# Patient Record
Sex: Male | Born: 1975
Health system: Southern US, Community
[De-identification: ages and names within clinical notes are randomized; demographics above are authoritative.]

## PROBLEM LIST (undated history)

## (undated) DIAGNOSIS — E559 Vitamin D deficiency, unspecified: Secondary | ICD-10-CM

## (undated) DIAGNOSIS — H8109 Meniere's disease, unspecified ear: Secondary | ICD-10-CM

## (undated) DIAGNOSIS — J302 Other seasonal allergic rhinitis: Secondary | ICD-10-CM

## (undated) DIAGNOSIS — I10 Essential (primary) hypertension: Secondary | ICD-10-CM

## (undated) DIAGNOSIS — B029 Zoster without complications: Secondary | ICD-10-CM

## (undated) DIAGNOSIS — F419 Anxiety disorder, unspecified: Secondary | ICD-10-CM

## (undated) DIAGNOSIS — T7840XA Allergy, unspecified, initial encounter: Secondary | ICD-10-CM

## (undated) DIAGNOSIS — G510 Bell's palsy: Secondary | ICD-10-CM

## (undated) DIAGNOSIS — J329 Chronic sinusitis, unspecified: Secondary | ICD-10-CM

## (undated) HISTORY — DX: Other seasonal allergic rhinitis: J30.2

## (undated) HISTORY — DX: Bell's palsy: G51.0

## (undated) HISTORY — DX: Vitamin D deficiency, unspecified: E55.9

## (undated) HISTORY — DX: Allergy, unspecified, initial encounter: T78.40XA

## (undated) HISTORY — DX: Zoster without complications: B02.9

## (undated) HISTORY — DX: Meniere's disease, unspecified ear: H81.09

## (undated) HISTORY — DX: Chronic sinusitis, unspecified: J32.9

## (undated) HISTORY — DX: Essential (primary) hypertension: I10

## (undated) HISTORY — DX: Anxiety disorder, unspecified: F41.9

## (undated) HISTORY — PX: NASAL SINUS SURGERY: SHX719

---

## 1999-07-30 ENCOUNTER — Emergency Department (HOSPITAL_COMMUNITY): Admission: EM | Admit: 1999-07-30 | Discharge: 1999-07-30 | Payer: Self-pay | Admitting: Emergency Medicine

## 1999-10-15 ENCOUNTER — Encounter: Payer: Self-pay | Admitting: Nephrology

## 1999-10-15 ENCOUNTER — Encounter: Admission: RE | Admit: 1999-10-15 | Discharge: 1999-10-15 | Payer: Self-pay | Admitting: Nephrology

## 1999-12-22 ENCOUNTER — Emergency Department (HOSPITAL_COMMUNITY): Admission: EM | Admit: 1999-12-22 | Discharge: 1999-12-22 | Payer: Self-pay | Admitting: Emergency Medicine

## 2000-06-10 ENCOUNTER — Emergency Department (HOSPITAL_COMMUNITY): Admission: EM | Admit: 2000-06-10 | Discharge: 2000-06-10 | Payer: Self-pay | Admitting: Emergency Medicine

## 2001-07-21 ENCOUNTER — Emergency Department (HOSPITAL_COMMUNITY): Admission: EM | Admit: 2001-07-21 | Discharge: 2001-07-21 | Payer: Self-pay | Admitting: Emergency Medicine

## 2007-10-17 ENCOUNTER — Emergency Department (HOSPITAL_COMMUNITY): Admission: EM | Admit: 2007-10-17 | Discharge: 2007-10-17 | Payer: Self-pay | Admitting: Emergency Medicine

## 2011-02-04 ENCOUNTER — Ambulatory Visit: Payer: Self-pay | Admitting: Internal Medicine

## 2011-02-25 ENCOUNTER — Ambulatory Visit: Payer: Self-pay | Admitting: Internal Medicine

## 2012-03-27 ENCOUNTER — Other Ambulatory Visit: Payer: Self-pay | Admitting: *Deleted

## 2012-03-27 ENCOUNTER — Ambulatory Visit (INDEPENDENT_AMBULATORY_CARE_PROVIDER_SITE_OTHER): Payer: 59 | Admitting: Family Medicine

## 2012-03-27 VITALS — BP 146/90 | HR 95 | Temp 98.8°F | Resp 18 | Ht 75.0 in | Wt 271.8 lb

## 2012-03-27 DIAGNOSIS — J019 Acute sinusitis, unspecified: Secondary | ICD-10-CM

## 2012-03-27 DIAGNOSIS — R05 Cough: Secondary | ICD-10-CM

## 2012-03-27 DIAGNOSIS — R0981 Nasal congestion: Secondary | ICD-10-CM

## 2012-03-27 DIAGNOSIS — J3489 Other specified disorders of nose and nasal sinuses: Secondary | ICD-10-CM

## 2012-03-27 DIAGNOSIS — J029 Acute pharyngitis, unspecified: Secondary | ICD-10-CM

## 2012-03-27 DIAGNOSIS — R059 Cough, unspecified: Secondary | ICD-10-CM

## 2012-03-27 MED ORDER — PREDNISONE 10 MG PO TABS: ORAL_TABLET | ORAL | Status: DC

## 2012-03-27 MED ORDER — AMOXICILLIN 875 MG PO TABS: 875.0000 mg | ORAL_TABLET | Freq: Two times a day (BID) | ORAL | Status: DC

## 2012-03-27 MED ORDER — HYDROCODONE-HOMATROPINE 5-1.5 MG/5ML PO SYRP: 5.0000 mL | ORAL_SOLUTION | Freq: Every evening | ORAL | Status: DC | PRN

## 2012-03-27 NOTE — Progress Notes (Signed)
[facility]:  Office Visit  Chief Complaint:  Chief Complaint  Patient presents with  . Cough    been having trouble for two months and feels like he is using too much nasal spray  . Sore Throat  . Nasal Congestion    HPI: Warren Herrera is a 36 y.o. male who complains of  Sinus /URI issues, severe sinus pressure and congestion x several months but has been busy and unable to come in now it is worse. Tried Afrin without relief, has been taking it a lot. Has had sinus surgeries years ago but still has sinus and congestion. Throat pain, can't sleep at night. Self medicate without relief. Started having HA and throat pain x 1.5 weeks. Tried Claritin and Mucinex and Afrin without relief. He thinks he has been to ENT before but does not want to go back there.   Past Medical History  Diagnosis Date  . Allergy    Past Surgical History  Procedure Date  . Nasal sinus surgery    History   Social History  . Marital Status: Married    Spouse Name: N/A    Number of Children: N/A  . Years of Education: N/A   Social History Main Topics  . Smoking status: Former Games developer  . Smokeless tobacco: None  . Alcohol Use: No  . Drug Use: No  . Sexually Active: None   Other Topics Concern  . None   Social History Narrative  . None   Family History  Problem Relation Age of Onset  . Hypertension Mother   . Thyroid disease Mother    No Known Allergies Prior to Admission medications   Medication Sig Start Date End Date Taking? Authorizing Provider  guaiFENesin (MUCINEX) 600 MG 12 hr tablet Take 1,200 mg by mouth 2 (two) times daily.   Yes Historical Provider, MD  oxymetazoline (AFRIN) 0.05 % nasal spray Place 2 sprays into the nose 4 (four) times daily.   Yes Historical Provider, MD     ROS: The patient denies fevers, chills, night sweats, unintentional weight loss, chest pain, palpitations, wheezing, dyspnea on exertion, nausea, vomiting, abdominal pain, dysuria,  hematuria, melena, numbness, weakness, or tingling.   All other systems have been reviewed and were otherwise negative with the exception of those mentioned in the HPI and as above.    PHYSICAL EXAM: Filed Vitals:   03/27/12 1639  BP: 146/90  Pulse: 95  Temp: 98.8 F (37.1 C)  Resp: 18   Filed Vitals:   03/27/12 1639  Height: 6\' 3"  (1.905 m)  Weight: 271 lb 12.8 oz (123.288 kg)   Body mass index is 33.97 kg/(m^2).  General: Alert, no acute distress HEENT:  Normocephalic, atraumatic, oropharynx patent. + bilateral frontal and maxillary sinus tenderness Cardiovascular:  Regular rate and rhythm, no rubs murmurs or gallops.  No Carotid bruits, radial pulse intact. No pedal edema.  Respiratory: Clear to auscultation bilaterally.  No wheezes, rales, or rhonchi.  No cyanosis, no use of accessory musculature GI: No organomegaly, abdomen is soft and non-tender, positive bowel sounds.  No masses. Skin: No rashes. Neurologic: Facial musculature symmetric. Psychiatric: Patient is appropriate throughout our interaction. Lymphatic: No cervical lymphadenopathy Musculoskeletal: Gait intact.   LABS: No results found for this or any previous visit.   EKG/XRAY:   Primary read interpreted by Dr. Conley Rolls at [facility].   ASSESSMENT/PLAN: Encounter Diagnoses  Name Primary?  . Acute sinusitis Yes  . Nasal congestion   . Cough   .  Pharyngitis     Rx Amoxacillin Rx Prednisone taper  Rx Hydromet syrup ( pt given rx)  Refer to ENT prn  F/u prn  Allex Madia PHUONG, DO 03/27/2012 5:46 PM

## 2012-04-01 ENCOUNTER — Telehealth: Payer: Self-pay

## 2012-04-01 MED ORDER — FLUTICASONE PROPIONATE 50 MCG/ACT NA SUSP: 2.0000 | Freq: Every day | NASAL | Status: DC

## 2012-04-01 NOTE — Telephone Encounter (Signed)
rx sent

## 2012-04-01 NOTE — Telephone Encounter (Signed)
Pt states that he is experiencing severe congestion, pt would like to know if flonase can be sent over to the pharmacy for him.  [facility]# [id number] [facility]: [facility] [address]

## 2012-04-02 ENCOUNTER — Telehealth: Payer: Self-pay

## 2012-04-02 NOTE — Telephone Encounter (Signed)
PT WANTED THE FLONASE THAT WE CALLED INTO [facility] [address] CALLED INTO THE [facility] [address]. [facility] [address] WAS WAS LISTED AS PT'S PREFERED PHARMACY IN THE ORIGINAL MESSAGE TAKEN. [facility]# [phone]

## 2012-04-02 NOTE — Telephone Encounter (Signed)
[facility] RX sent in.

## 2012-04-02 NOTE — Telephone Encounter (Deleted)
ERROR

## 2012-04-03 NOTE — Telephone Encounter (Signed)
Called pt, he can have the pharmacy transfer this

## 2015-08-14 DIAGNOSIS — R030 Elevated blood-pressure reading, without diagnosis of hypertension: Secondary | ICD-10-CM | POA: Insufficient documentation

## 2018-01-28 ENCOUNTER — Ambulatory Visit: Payer: Self-pay | Admitting: Family Medicine

## 2018-02-26 ENCOUNTER — Other Ambulatory Visit: Payer: Self-pay

## 2018-02-26 ENCOUNTER — Emergency Department (INDEPENDENT_AMBULATORY_CARE_PROVIDER_SITE_OTHER)
Admission: EM | Admit: 2018-02-26 | Discharge: 2018-02-26 | Disposition: A | Discharge status: Home / Self Care | Payer: 59 | Source: Home / Self Care | Attending: Family Medicine | Admitting: Family Medicine

## 2018-02-26 ENCOUNTER — Emergency Department (INDEPENDENT_AMBULATORY_CARE_PROVIDER_SITE_OTHER): Payer: 59

## 2018-02-26 DIAGNOSIS — M549 Dorsalgia, unspecified: Secondary | ICD-10-CM | POA: Diagnosis not present

## 2018-02-26 DIAGNOSIS — R0789 Other chest pain: Secondary | ICD-10-CM

## 2018-02-26 DIAGNOSIS — R079 Chest pain, unspecified: Secondary | ICD-10-CM

## 2018-02-26 MED ORDER — VALACYCLOVIR HCL 1 G PO TABS: 1000.0000 mg | ORAL_TABLET | Freq: Three times a day (TID) | ORAL | 0 refills | Status: DC

## 2018-02-26 MED ORDER — HYDROCODONE-ACETAMINOPHEN 5-325 MG PO TABS: ORAL_TABLET | ORAL | 0 refills | Status: DC

## 2018-02-26 NOTE — ED Triage Notes (Signed)
Pt c/o a piercing back pain that radiates around the right side of chest. Started last Sunday. Denies Injury. Worse at night especially lying down. Taking ibuprofen and a muscle relaxer prn with little relief. Pain 7/10

## 2018-02-26 NOTE — Discharge Instructions (Addendum)
Begin taking Valtrex if rash appears on chest

## 2018-02-26 NOTE — ED Provider Notes (Signed)
[facility]    CSN: [id number] Arrival date & time: 02/26/18  1547     History   Chief Complaint Chief Complaint  Patient presents with  . Back Pain    HPI Warren Herrera is a 42 y.o. male.   Six days ago patient began developing a vague sharp ache in his right lateral chest.  The pain initially was intermittent, and now constant, worse at night.  He recalls no injury.  He denies cough or recent URI.  No fevers, chills, and sweats, and no rash.  The history is provided by the patient.  Chest Pain  Pain location:  R lateral chest Pain quality: stabbing   Pain radiates to:  Upper back Pain severity:  Moderate Onset quality:  Sudden Duration:  6 days Timing:  Constant Progression:  Worsening Context: at rest   Context: not breathing, not eating, not movement, not raising an arm, not stress and not trauma   Relieved by:  Nothing Worsened by:  Nothing Ineffective treatments: Ibuprofen and muscle relaxant. Associated symptoms: no abdominal pain, no AICD problem, no back pain, no cough, no diaphoresis, no dysphagia, no fatigue, no fever, no headache, no heartburn, no lower extremity edema, no nausea, no palpitations, no PND, no shortness of breath and no syncope     Past Medical History:  Diagnosis Date  . Allergy     Active problems:  Seasonal rhinitis   Past Surgical History:  Procedure Laterality Date  . NASAL SINUS SURGERY         Home Medications    Prior to Admission medications   Medication Sig Start Date End Date Taking? Authorizing Provider  amoxicillin (AMOXIL) 875 MG tablet Take 1 tablet (875 mg total) by mouth 2 (two) times daily. 03/27/12   Le, Thao P, DO  fluticasone (FLONASE) 50 MCG/ACT nasal spray Place 2 sprays into the nose daily. 04/01/12   Porfirio Oar, PA  guaiFENesin (MUCINEX) 600 MG 12 hr tablet Take 1,200 mg by mouth 2 (two) times daily.    [provider]  HYDROcodone-acetaminophen (NORCO/VICODIN) 5-325 MG tablet  Take one by mouth at bedtime as needed for pain.  May repeat in 4 to 6 hours prn 02/26/18   Lattie Haw, MD  HYDROcodone-homatropine [facility]) 5-1.5 MG/5ML syrup Take 5 mLs by mouth at bedtime as needed for cough. 03/27/12   Le, Thao P, DO  oxymetazoline (AFRIN) 0.05 % nasal spray Place 2 sprays into the nose 4 (four) times daily.    [provider]  predniSONE (DELTASONE) 10 MG tablet Take 3 tabs PO daily  for 2 days, then 2 tabs PO daily  for 2 days, then 1 tab PO daily for 2 days 03/27/12   Le, Thao P, DO  valACYclovir (VALTREX) 1000 MG tablet Take 1 tablet (1,000 mg total) by mouth 3 (three) times daily. 02/26/18   Lattie Haw, MD    Family History Family History  Problem Relation Age of Onset  . Hypertension Mother   . Thyroid disease Mother     Social History Social History   Tobacco Use  . Smoking status: Former Games developer  . Smokeless tobacco: Never Used  Substance Use Topics  . Alcohol use: No  . Drug use: No     Allergies   Patient has no known allergies.   Review of Systems Review of Systems  Constitutional: Negative for diaphoresis, fatigue and fever.  HENT: Negative for trouble swallowing.   Respiratory: Negative for cough and  shortness of breath.   Cardiovascular: Positive for chest pain. Negative for palpitations, syncope and PND.  Gastrointestinal: Negative for abdominal pain, heartburn and nausea.  Musculoskeletal: Negative for back pain.  Neurological: Negative for headaches.  All other systems reviewed and are negative.    Physical Exam Triage Vital Signs ED Triage Vitals  Enc Vitals Group     BP 02/26/18 1610 (!) 160/104     Pulse Rate 02/26/18 1610 79     Resp --      Temp 02/26/18 1610 98 F (36.7 C)     Temp Source 02/26/18 1610 Oral     SpO2 02/26/18 1610 98 %     Weight 02/26/18 1611 283 lb (128.4 kg)     Height 02/26/18 1611 6\' 3"  (1.905 m)     Head Circumference --      Peak Flow --      Pain Score 02/26/18 1610 7      Pain Loc --      Pain Edu? --      Excl. in GC? --    No data found.  Updated Vital Signs BP (!) 160/104 (BP Location: Right Arm)   Pulse 79   Temp 98 F (36.7 C) (Oral)   Ht 6\' 3"  (1.905 m)   Wt 128.4 kg   SpO2 98%   BMI 35.37 kg/m   Visual Acuity Right Eye Distance:   Left Eye Distance:   Bilateral Distance:    Right Eye Near:   Left Eye Near:    Bilateral Near:     Physical Exam  Constitutional: He appears well-developed and well-nourished. No distress.  HENT:  Head: Normocephalic.  Right Ear: External ear normal.  Left Ear: External ear normal.  Nose: Nose normal.  Mouth/Throat: Oropharynx is clear and moist.  Eyes: Pupils are equal, round, and reactive to light. Conjunctivae are normal.  Neck: Normal range of motion. Neck supple.  Cardiovascular: Normal heart sounds.  Pulmonary/Chest: Breath sounds normal.      Patient has pain in a distribution as noted on diagram, but there is no tenderness to palpation in this area.  No rash present.     Abdominal: There is no tenderness.  Musculoskeletal: He exhibits no edema or tenderness.  Lymphadenopathy:    He has no cervical adenopathy.  Skin: Skin is warm and dry. No rash noted. He is not diaphoretic.  Nursing note and vitals reviewed.    UC Treatments / Results  Labs (all labs ordered are listed, but only abnormal results are displayed) Labs Reviewed - No data to display  EKG  Rate:  68 BPM PR:  160 msec QT:  370 msec QTcH:  393 msec QRSD:  88 msec QRS axis:  44 degrees Interpretation:  normal sinus rhythm; within normal limits   Radiology Dg Chest 2 View  Result Date: 02/26/2018 CLINICAL DATA:  42 y/o M; worsening right-sided chest and back pain. EXAM: CHEST - 2 VIEW COMPARISON:  None. FINDINGS: The heart size and mediastinal contours are within normal limits. Both lungs are clear. The visualized skeletal structures are unremarkable. IMPRESSION: No acute pulmonary process identified.  Electronically Signed   By: Mitzi HansenLance  Furusawa-Stratton M.D.   On: 02/26/2018 17:45    Procedures Procedures (including critical care time)  Medications Ordered in UC Medications - No data to display  Initial Impression / Assessment and Plan / UC Course  I have reviewed the triage vital signs and the nursing notes.  Pertinent labs & imaging  results that were available during my care of the patient were reviewed by me and considered in my medical decision making (see chart for details).    Normal EKG and chest X-ray reassuring. Symptoms highly suggestive of early shingles. Rx for Valtrex.  Rx for Lortab for pain at night (#10, no refill). Controlled Substance Prescriptions I have consulted the [place] Controlled Substances Registry for this patient, and feel the risk/benefit ratio today is favorable for proceeding with this prescription for a controlled substance.   Followup with Family Doctor if not improved in two weeks.   Final Clinical Impressions(s) / UC Diagnoses   Final diagnoses:  Chest pain, unspecified type     Discharge Instructions     Begin taking Valtrex if rash appears on chest    ED Prescriptions    Medication Sig Dispense Auth. Provider   valACYclovir (VALTREX) 1000 MG tablet Take 1 tablet (1,000 mg total) by mouth 3 (three) times daily. 21 tablet Lattie Haw, MD   HYDROcodone-acetaminophen (NORCO/VICODIN) 5-325 MG tablet Take one by mouth at bedtime as needed for pain.  May repeat in 4 to 6 hours prn 10 tablet Cathren Harsh Tera Mater, MD        Lattie Haw, MD 03/04/18 [id number]

## 2018-02-27 ENCOUNTER — Telehealth: Payer: Self-pay | Admitting: Emergency Medicine

## 2018-03-11 ENCOUNTER — Encounter: Payer: Self-pay | Admitting: Family Medicine

## 2018-03-11 ENCOUNTER — Ambulatory Visit (INDEPENDENT_AMBULATORY_CARE_PROVIDER_SITE_OTHER): Payer: 59 | Admitting: Family Medicine

## 2018-03-11 VITALS — BP 142/94 | HR 88 | Ht 75.0 in | Wt 282.0 lb

## 2018-03-11 DIAGNOSIS — Z125 Encounter for screening for malignant neoplasm of prostate: Secondary | ICD-10-CM

## 2018-03-11 DIAGNOSIS — Z6835 Body mass index (BMI) 35.0-35.9, adult: Secondary | ICD-10-CM | POA: Diagnosis not present

## 2018-03-11 DIAGNOSIS — Z13220 Encounter for screening for lipoid disorders: Secondary | ICD-10-CM

## 2018-03-11 DIAGNOSIS — R208 Other disturbances of skin sensation: Secondary | ICD-10-CM | POA: Diagnosis not present

## 2018-03-11 DIAGNOSIS — B0229 Other postherpetic nervous system involvement: Secondary | ICD-10-CM

## 2018-03-11 DIAGNOSIS — R030 Elevated blood-pressure reading, without diagnosis of hypertension: Secondary | ICD-10-CM | POA: Diagnosis not present

## 2018-03-11 DIAGNOSIS — Z131 Encounter for screening for diabetes mellitus: Secondary | ICD-10-CM

## 2018-03-11 MED ORDER — LIDOCAINE 5 % EX PTCH: MEDICATED_PATCH | CUTANEOUS | 0 refills | Status: DC

## 2018-03-11 MED ORDER — PREGABALIN 75 MG PO CAPS: 75.0000 mg | ORAL_CAPSULE | Freq: Two times a day (BID) | ORAL | 3 refills | Status: DC

## 2018-03-11 NOTE — Progress Notes (Signed)
New patient office visit note:  Impression and Recommendations:    1. Elevated blood-pressure reading without diagnosis of hypertension   2. BMI 35.0-35.9,adult   3. Postherpetic neuralgia at T3-T5 level- R post chest wall   4. Hyperalgesia   5. Screening for lipid disorders   6. Screening for prostate cancer   7. Screening for diabetes mellitus      Elevated blood-pressure reading without diagnosis of hypertension - Plan: CBC with Differential/Platelet, Comprehensive metabolic panel, Lipid panel, TSH, T4, free  BMI 35.0-35.9,adult - Plan: Hemoglobin A1c, Lipid panel, VITAMIN D 25 Hydroxy (Vit-D Deficiency, Fractures)  Postherpetic neuralgia at T3-T5 level- R post chest wall - Plan: pregabalin (LYRICA) 75 MG capsule, lidocaine (LIDODERM) 5 %  Hyperalgesia - Plan: pregabalin (LYRICA) 75 MG capsule, lidocaine (LIDODERM) 5 %, VITAMIN D 25 Hydroxy (Vit-D Deficiency, Fractures)  Screening for lipid disorders - Plan: Lipid panel  Screening for prostate cancer - Plan: PSA, total and free  Screening for diabetes mellitus - Plan: Hemoglobin A1c   1. Post hepatic neurologic syndrome -Diagnosed with shingles by another provider and treated with valtrex and Vicodin.  -Patient wasn't given any neuropathic medications at the time of his diagnosis. -Current post-hepatic neurologic symptoms started 3 weeks ago -Will treat the patient with lyrica today.  -Will give the patient a prescription for 5% lidoderm patch today.   2. Essential HTN -Your goal blood pressure should be 135/85 or less on a regular basis, or medications should be started/ modified. Normal blood pressure is less than 120/80.  -Blood pressure recheck in the office today at 142/94 and heart rate recheck at 88.   -Advised with the patient that if his blood pressure is not at or below goal of 135/85, then he will be started on medications on his next visit.   -Lifestyle changes such as dash diet and engaging in a  regular exercise program discussed with patient.  Educational handouts provided  -Ambulatory BP and heart rate monitoring encouraged at least 3 times a week or when symptomatic. Keep log and bring in next OV  -Continue current medication(s). Also, risks and benefits of medications discussed with patient, including alternative treatments. Encouraged patient to read drug information handouts to further educate self about the medicine prior to starting it.   Contact us prior with any Q's/ concerns.  3. BMI Counseling -Patient does cardio 3-4 times a week for 35-40 minutes and 15 minutes for weight lifting and he runs about 2 miles per day.  -Explained to patient what BMI refers to, and what it means medically. Told patient to think about it as a "medical risk stratification measurement" and how increasing BMI is associated with increasing risk/ or worsening state of various diseases such as hypertension, hyperlipidemia, diabetes, premature OA, depression etc.  -American Heart Association guidelines for healthy diet, basically Mediterranean diet, and exercise guidelines of 30 minutes 5 days per week or more discussed in detail.  -Health counseling performed.  All questions answered.   Follow up in 2 months for fasting labs a couple days prior to OV for HTN follow up. Follow sooner if blood pressure is elevated.    Education and routine counseling performed. Handouts provided.   Orders Placed This Encounter  Procedures  . CBC with Differential/Platelet  . Comprehensive metabolic panel  . Hemoglobin A1c  . Lipid panel  . VITAMIN D 25 Hydroxy (Vit-D Deficiency, Fractures)  . TSH  . T4, free  . PSA, total and free  Meds ordered this encounter  Medications  . pregabalin (LYRICA) 75 MG capsule    Sig: Take 1 capsule (75 mg total) by mouth 2 (two) times daily.    Dispense:  60 capsule    Refill:  3  . lidocaine (LIDODERM) 5 %    Sig: Apply to skin over most painful area of back,  every 12 hours as needed for pain    Dispense:  30 patch    Refill:  0    Medications Discontinued During This Encounter  Medication Reason  . amoxicillin (AMOXIL) 875 MG tablet Completed Course  . fluticasone (FLONASE) 50 MCG/ACT nasal spray Completed Course  . guaiFENesin (MUCINEX) 600 MG 12 hr tablet Completed Course  . HYDROcodone-homatropine (HYCODAN) 5-1.5 MG/5ML syrup Completed Course  . oxymetazoline (AFRIN) 0.05 % nasal spray Completed Course  . predniSONE (DELTASONE) 10 MG tablet Completed Course  . HYDROcodone-acetaminophen (NORCO/VICODIN) 5-325 MG tablet Completed Course     Gross side effects, risk and benefits, and alternatives of medications discussed with patient.  Patient is aware that all medications have potential side effects and we are unable to predict every side effect or drug-drug interaction that may occur.  Expresses verbal understanding and consents to current therapy plan and treatment regimen.  Return for 2) 53-month OV for blood pressure and review labs, FBW 1 week prior.  Please see AVS handed out to patient at the end of our visit for further patient instructions/ counseling done pertaining to today's office visit.    Note:  This document was prepared using Dragon voice recognition software and may include unintentional dictation errors.  This document serves as a record of services personally performed by Thomasene Lot, DO. It was created on her behalf by Chestine Spore, a trained medical scribe. The creation of this record is based on the scribe's personal observations and the provider's statements to them.   I have reviewed the above medical documentation for accuracy and completeness and I concur.  Thomasene Lot,  DO    ------------------------------------------------------------------------------------------------------------------------------------------------------------------------------------------------------------------------------     Subjective:    Chief complaint:   Chief Complaint  Patient presents with  . Establish Care     HPI: Warren Herrera is a pleasant 42 y.o. male who presents to [facility] today to review their medical history with me and establish care.   I asked the patient to review their chronic problem list with me to ensure everything was updated and accurate.    All recent office visits with other providers, any medical records that patient brought in etc  - I reviewed today.     We asked pt to get [place] their medical records from [facility] providers/ specialists that they had seen within the past 3-5 years- if they are in private practice and/or do not work for [employer], [facility], [facility], [facility] or [facility] owned practice. Told them to call their specialists to clarify this if they are not sure.   His previous PCP, Dr. Tracey Harries left his practice 1 year ago and he was last seen by him in 2018. He has moved to the area 2 years ago.   PMHx:  He was recently diagnosed with shingles to the middle of his back that is worse at night. The pain isn't painful to touch, however, it is worse throughout the day. He was not treated with any medications for his post-neurologic syndrome. He was treated with Valtrex and Vicodin.   He has sinus issues  and seasonal allergies and his ENT specialist, is Dr. Marisa SprinklesPhipps.   He has borderline HTN. His blood pressure in the office is 148/97. He has a blood pressure cuff at home. He denies lower extremity swelling.   FHx: His mother has a hx of DM and was diagnosed in her 7350's. His mother has thyroid issues that was diagnosed in her 3820's. He has a significant hx of HTN on his paternal and maternal sides. His paternal  grandfather died of prostate cancer in his 1160's.   PSHx: He had sinus surgery about 10 years ago. He denies joint surgeries.   SHx: He is from the [place] area. 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 He works by appointment and it is flexible. He is married and has 5 kids with his youngest being 41 years old. He has 2 grandchildren. He smoked for 3-5 years 1 PPD and quit 15 years ago. He denies drinking alcohol at this time. He drinks 2 cups of coffee daily. He does cardio 3-4 times a week for 35-40 minutes and 15 minutes for weight lifting and he runs about 2 miles per day.       Wt Readings from Last 3 Encounters:  03/11/18 282 lb (127.9 kg)  02/26/18 283 lb (128.4 kg)  03/27/12 271 lb 12.8 oz (123.3 kg)   BP Readings from Last 3 Encounters:  03/11/18 (!) 142/94  02/26/18 (!) 160/104  03/27/12 (!) 146/90   Pulse Readings from Last 3 Encounters:  03/11/18 88  02/26/18 79  03/27/12 95   BMI Readings from Last 3 Encounters:  03/11/18 35.25 kg/m  02/26/18 35.37 kg/m  03/27/12 33.97 kg/m    Patient Care Team    Relationship Specialty Notifications Start End  Thomasene Lotpalski, Josy Peaden, DO PCP - General Family Medicine  11/25/17   Tracey HarriesBouska, David, MD Referring Physician Family Medicine  03/11/18     Patient Active Problem List   Diagnosis Date Noted  . Postherpetic neuralgia at T3-T5 level- R post chest wall 03/11/2018  . BMI 35.0-35.9,adult 03/11/2018  . Elevated blood-pressure reading without diagnosis of hypertension 08/14/2015       As reported by pt:  Past Medical History:  Diagnosis Date  . Allergy      Past Surgical History:  Procedure Laterality Date  . NASAL SINUS SURGERY       Family History  Problem Relation Age of Onset  . Hypertension Mother   . Thyroid disease Mother      Social History   Substance and Sexual Activity  Drug Use No     Social History   Substance  and Sexual Activity  Alcohol Use No     Social History   Tobacco Use  Smoking Status Former Smoker  . Packs/day: 1.00  . Years: 4.00  . Pack years: 4.00  . Types: Cigarettes  . Last attempt to quit: 2004  . Years since quitting: 15.9  Smokeless Tobacco Never Used     Current Meds  Medication Sig  . loratadine-pseudoephedrine (CLARITIN-D 12-HOUR) 5-120 MG tablet Take 1 tablet by mouth 2 (two) times daily.    Allergies: Patient has no known allergies.   Review of Systems  Constitutional: Negative for chills, diaphoresis, fever, malaise/fatigue and weight loss.  HENT: Negative for congestion, sore throat and tinnitus.   Eyes: Negative for blurred vision, double vision and photophobia.  Respiratory: Negative for cough and  wheezing.   Cardiovascular: Negative for chest pain and palpitations.  Gastrointestinal: Negative for blood in stool, diarrhea, nausea and vomiting.  Genitourinary: Negative for dysuria, frequency and urgency.  Musculoskeletal: Negative for joint pain and myalgias.  Skin: Negative for itching and rash.  Neurological: Negative for dizziness, focal weakness, weakness and headaches.  Endo/Heme/Allergies: Negative for environmental allergies and polydipsia. Does not bruise/bleed easily.  Psychiatric/Behavioral: Negative for depression and memory loss. The patient is not nervous/anxious and does not have insomnia.         Objective:   Blood pressure (!) 142/94, pulse 88, height 6\' 3"  (1.905 m), weight 282 lb (127.9 kg), SpO2 100 %. Body mass index is 35.25 kg/m. General: Well Developed, well nourished, and in no acute distress.  Neuro: Alert and oriented x3, extra-ocular muscles intact, sensation grossly intact.  HEENT:[place]/AT, PERRLA, neck supple, No carotid bruits Skin: no gross rashes  Cardiac: Regular rate and rhythm Respiratory: Essentially clear to auscultation bilaterally. Not using accessory muscles, speaking in full sentences.  Abdominal: not  grossly distended Musculoskeletal: Ambulates w/o diff, FROM * 4 ext.  Vasc: less 2 sec cap RF, warm and pink  Psych:  No HI/SI, judgement and insight good, Euthymic mood. Full Affect.    No results found for this or any previous visit (from the past 2160 hour(s)).

## 2018-03-11 NOTE — Patient Instructions (Signed)
Your goal blood pressure should be 130/80 or less on a regular basis, or medications should be started/ modified.    Normal blood pressure is less than 120/80.    Hypertension Hypertension, commonly called high blood pressure, is when the force of blood pumping through the arteries is too strong. The arteries are the blood vessels that carry blood from the heart throughout the body. Hypertension forces the heart to work harder to pump blood and may cause arteries to become narrow or stiff. Having untreated or uncontrolled hypertension can cause heart attacks, strokes, kidney disease, and other problems. A blood pressure reading consists of a higher number over a lower number. Ideally, your blood pressure should be below 120/80. The first ("top") number is called the systolic pressure. It is a measure of the pressure in your arteries as your heart beats. The second ("bottom") number is called the diastolic pressure. It is a measure of the pressure in your arteries as the heart relaxes. What are the causes? The cause of this condition is not known. What increases the risk? Some risk factors for high blood pressure are under your control. Others are not. Factors you can change  Smoking.  Having type 2 diabetes mellitus, high cholesterol, or both.  Not getting enough exercise or physical activity.  Being overweight.  Having too much fat, sugar, calories, or salt (sodium) in your diet.  Drinking too much alcohol. Factors that are difficult or impossible to change  Having chronic kidney disease.  Having a family history of high blood pressure.  Age. Risk increases with age.  Race. You may be at higher risk if you are African-American.  Gender. Men are at higher risk than women before age 45. After age 65, women are at higher risk than men.  Having obstructive sleep apnea.  Stress. What are the signs or symptoms? Extremely high blood pressure (hypertensive crisis) may cause:   Headache.  Anxiety.  Shortness of breath.  Nosebleed.  Nausea and vomiting.  Severe chest pain.  Jerky movements you cannot control (seizures).  How is this diagnosed? This condition is diagnosed by measuring your blood pressure while you are seated, with your arm resting on a surface. The cuff of the blood pressure monitor will be placed directly against the skin of your upper arm at the level of your heart. It should be measured at least twice using the same arm. Certain conditions can cause a difference in blood pressure between your right and left arms. Certain factors can cause blood pressure readings to be lower or higher than normal (elevated) for a short period of time:  When your blood pressure is higher when you are in a health care provider's office than when you are at home, this is called white coat hypertension. Most people with this condition do not need medicines.  When your blood pressure is higher at home than when you are in a health care provider's office, this is called masked hypertension. Most people with this condition may need medicines to control blood pressure.  If you have a high blood pressure reading during one visit or you have normal blood pressure with other risk factors:  You may be asked to return on a different day to have your blood pressure checked again.  You may be asked to monitor your blood pressure at home for 1 week or longer.  If you are diagnosed with hypertension, you may have other blood or imaging tests to help your health care provider understand   your overall risk for other conditions. How is this treated? This condition is treated by making healthy lifestyle changes, such as eating healthy foods, exercising more, and reducing your alcohol intake. Your health care provider may prescribe medicine if lifestyle changes are not enough to get your blood pressure under control, and if:  Your systolic blood pressure is above 130.  Your  diastolic blood pressure is above 80.  Your personal target blood pressure may vary depending on your medical conditions, your age, and other factors. Follow these instructions at home: Eating and drinking  Eat a diet that is high in fiber and potassium, and low in sodium, added sugar, and fat. An example eating plan is called the DASH (Dietary Approaches to Stop Hypertension) diet. To eat this way: ? Eat plenty of fresh fruits and vegetables. Try to fill half of your plate at each meal with fruits and vegetables. ? Eat whole grains, such as whole wheat pasta, brown rice, or whole grain bread. Fill about one quarter of your plate with whole grains. ? Eat or drink low-fat dairy products, such as skim milk or low-fat yogurt. ? Avoid fatty cuts of meat, processed or cured meats, and poultry with skin. Fill about one quarter of your plate with lean proteins, such as fish, chicken without skin, beans, eggs, and tofu. ? Avoid premade and processed foods. These tend to be higher in sodium, added sugar, and fat.  Reduce your daily sodium intake. Most people with hypertension should eat less than 1,500 mg of sodium a day.  Limit alcohol intake to no more than 1 drink a day for nonpregnant women and 2 drinks a day for men. One drink equals 12 oz of beer, 5 oz of wine, or 1 oz of hard liquor. Lifestyle  Work with your health care provider to maintain a healthy body weight or to lose weight. Ask what an ideal weight is for you.  Get at least 30 minutes of exercise that causes your heart to beat faster (aerobic exercise) most days of the week. Activities may include walking, swimming, or biking.  Include exercise to strengthen your muscles (resistance exercise), such as pilates or lifting weights, as part of your weekly exercise routine. Try to do these types of exercises for 30 minutes at least 3 days a week.  Do not use any products that contain nicotine or tobacco, such as cigarettes and e-cigarettes.  If you need help quitting, ask your health care provider.  Monitor your blood pressure at home as told by your health care provider.  Keep all follow-up visits as told by your health care provider. This is important. Medicines  Take over-the-counter and prescription medicines only as told by your health care provider. Follow directions carefully. Blood pressure medicines must be taken as prescribed.  Do not skip doses of blood pressure medicine. Doing this puts you at risk for problems and can make the medicine less effective.  Ask your health care provider about side effects or reactions to medicines that you should watch for. Contact a health care provider if:  You think you are having a reaction to a medicine you are taking.  You have headaches that keep coming back (recurring).  You feel dizzy.  You have swelling in your ankles.  You have trouble with your vision. Get help right away if:  You develop a severe headache or confusion.  You have unusual weakness or numbness.  You feel faint.  You have severe pain in your chest   or abdomen.  You vomit repeatedly.  You have trouble breathing. Summary  Hypertension is when the force of blood pumping through your arteries is too strong. If this condition is not controlled, it may put you at risk for serious complications.  Your personal target blood pressure may vary depending on your medical conditions, your age, and other factors. For most people, a normal blood pressure is less than 120/80.  Hypertension is treated with lifestyle changes, medicines, or a combination of both. Lifestyle changes include weight loss, eating a healthy, low-sodium diet, exercising more, and limiting alcohol. This information is not intended to replace advice given to you by your health care provider. Make sure you discuss any questions you have with your health care provider. Document Released: 03/23/2005 Document Revised: 02/19/2016 Document  Reviewed: 02/19/2016 [employer] Interactive Patient Education  2018 [employer]    How to Take Your Blood Pressure   Blood pressure is a measurement of how strongly your blood is pressing against the walls of your arteries. Arteries are blood vessels that carry blood from your heart throughout your body. Your health care provider takes your blood pressure at each office visit. You can also take your own blood pressure at home with a blood pressure machine. You may need to take your own blood pressure:  To confirm a diagnosis of high blood pressure (hypertension).  To monitor your blood pressure over time.  To make sure your blood pressure medicine is working.  Supplies needed: To take your blood pressure, you will need a blood pressure machine. You can buy a blood pressure machine, or blood pressure monitor, at most drugstores or online. There are several types of home blood pressure monitors. When choosing one, consider the following:  Choose a monitor that has an arm cuff.  Choose a monitor that wraps snugly around your upper arm. You should be able to fit only one finger between your arm and the cuff.  Do not choose a monitor that measures your blood pressure from your wrist or finger.  Your health care provider can suggest a reliable monitor that will meet your needs. How to prepare To get the most accurate reading, avoid the following for 30 minutes before you check your blood pressure:  Drinking caffeine.  Drinking alcohol.  Eating.  Smoking.  Exercising.  Five minutes before you check your blood pressure:  Empty your bladder.  Sit quietly without talking in a dining chair, rather than in a soft couch or armchair.  How to take your blood pressure To check your blood pressure, follow the instructions in the manual that came with your blood pressure monitor. If you have a digital blood pressure monitor, the instructions may be as follows: 1. Sit up straight. 2.  Place your feet on the floor. Do not cross your ankles or legs. 3. Rest your left arm at the level of your heart on a table or desk or on the arm of a chair. 4. Pull up your shirt sleeve. 5. Wrap the blood pressure cuff around the upper part of your left arm, 1 inch (2.5 cm) above your elbow. It is best to wrap the cuff around bare skin. 6. Fit the cuff snugly around your arm. You should be able to place only one finger between the cuff and your arm. 7. Position the cord inside the groove of your elbow. 8. Press the power button. 9. Sit quietly while the cuff inflates and deflates. 10. Read the digital reading on the monitor   screen and write it down (record it). 11. Wait 2-3 minutes, then repeat the steps, starting at step 1.  What does my blood pressure reading mean? A blood pressure reading consists of a higher number over a lower number. Ideally, your blood pressure should be below 120/80. The first ("top") number is called the systolic pressure. It is a measure of the pressure in your arteries as your heart beats. The second ("bottom") number is called the diastolic pressure. It is a measure of the pressure in your arteries as the heart relaxes. Blood pressure is classified into four stages. The following are the stages for adults who do not have a short-term serious illness or a chronic condition. Systolic pressure and diastolic pressure are measured in a unit called mm Hg. Normal  Systolic pressure: below 120.  Diastolic pressure: below 80. Elevated  Systolic pressure: 120-129.  Diastolic pressure: below 80. Hypertension stage 1  Systolic pressure: 130-139.  Diastolic pressure: 80-89. Hypertension stage 2  Systolic pressure: 140 or above.  Diastolic pressure: 90 or above. You can have prehypertension or hypertension even if only the systolic or only the diastolic number in your reading is higher than normal. Follow these instructions at home:  Check your blood pressure as  often as recommended by your health care provider.  Take your monitor to the next appointment with your health care provider to make sure: ? That you are using it correctly. ? That it provides accurate readings.  Be sure you understand what your goal blood pressure numbers are.  Tell your health care provider if you are having any side effects from blood pressure medicine. Contact a health care provider if:  Your blood pressure is consistently high. Get help right away if:  Your systolic blood pressure is higher than 180.  Your diastolic blood pressure is higher than 110. This information is not intended to replace advice given to you by your health care provider. Make sure you discuss any questions you have with your health care provider. Document Released: 08/30/2015 Document Revised: 11/12/2015 Document Reviewed: 08/30/2015 [employer] Interactive Patient Education  2018 [employer]   

## 2018-05-11 ENCOUNTER — Other Ambulatory Visit: Payer: 59

## 2018-05-16 ENCOUNTER — Other Ambulatory Visit: Payer: PRIVATE HEALTH INSURANCE

## 2018-05-16 DIAGNOSIS — Z13220 Encounter for screening for lipoid disorders: Secondary | ICD-10-CM

## 2018-05-16 DIAGNOSIS — Z125 Encounter for screening for malignant neoplasm of prostate: Secondary | ICD-10-CM

## 2018-05-16 DIAGNOSIS — Z6835 Body mass index (BMI) 35.0-35.9, adult: Secondary | ICD-10-CM

## 2018-05-16 DIAGNOSIS — Z131 Encounter for screening for diabetes mellitus: Secondary | ICD-10-CM

## 2018-05-16 DIAGNOSIS — R030 Elevated blood-pressure reading, without diagnosis of hypertension: Secondary | ICD-10-CM

## 2018-05-16 DIAGNOSIS — R208 Other disturbances of skin sensation: Secondary | ICD-10-CM

## 2018-05-17 LAB — LIPID PANEL
CHOL/HDL RATIO: 2.8 ratio (ref 0.0–5.0)
Cholesterol, Total: 140 mg/dL (ref 100–199)
HDL: 50 mg/dL (ref >39)
LDL Calculated: 81 mg/dL (ref 0–99)
TRIGLYCERIDES: 44 mg/dL (ref 0–149)
VLDL CHOLESTEROL CAL: 9 mg/dL (ref 5–40)

## 2018-05-17 LAB — COMPREHENSIVE METABOLIC PANEL
A/G RATIO: 1.7 (ref 1.2–2.2)
ALK PHOS: 61 IU/L (ref 39–117)
ALT: 36 IU/L (ref 0–44)
AST: 23 IU/L (ref 0–40)
Albumin: 4.1 g/dL (ref 4.0–5.0)
BUN/Creatinine Ratio: 11 (ref 9–20)
BUN: 12 mg/dL (ref 6–24)
Bilirubin Total: 0.2 mg/dL (ref 0.0–1.2)
CO2: 24 mmol/L (ref 20–29)
Calcium: 9.1 mg/dL (ref 8.7–10.2)
Chloride: 104 mmol/L (ref 96–106)
Creatinine, Ser: 1.12 mg/dL (ref 0.76–1.27)
GFR calc Af Amer: 94 mL/min/{1.73_m2} (ref >59)
GFR calc non Af Amer: 81 mL/min/{1.73_m2} (ref >59)
GLOBULIN, TOTAL: 2.4 g/dL (ref 1.5–4.5)
Glucose: 94 mg/dL (ref 65–99)
POTASSIUM: 4.5 mmol/L (ref 3.5–5.2)
SODIUM: 141 mmol/L (ref 134–144)
Total Protein: 6.5 g/dL (ref 6.0–8.5)

## 2018-05-17 LAB — CBC WITH DIFFERENTIAL/PLATELET
BASOS: 1 %
Basophils Absolute: 0 10*3/uL (ref 0.0–0.2)
EOS (ABSOLUTE): 0.2 10*3/uL (ref 0.0–0.4)
Eos: 4 %
Hematocrit: 40.7 % (ref 37.5–51.0)
Hemoglobin: 13.6 g/dL (ref 13.0–17.7)
Immature Grans (Abs): 0 10*3/uL (ref 0.0–0.1)
Immature Granulocytes: 0 %
Lymphocytes Absolute: 1.9 10*3/uL (ref 0.7–3.1)
Lymphs: 47 %
MCH: 27.7 pg (ref 26.6–33.0)
MCHC: 33.4 g/dL (ref 31.5–35.7)
MCV: 83 fL (ref 79–97)
MONOS ABS: 0.3 10*3/uL (ref 0.1–0.9)
Monocytes: 8 %
Neutrophils Absolute: 1.7 10*3/uL (ref 1.4–7.0)
Neutrophils: 40 %
PLATELETS: 223 10*3/uL (ref 150–450)
RBC: 4.91 x10E6/uL (ref 4.14–5.80)
RDW: 13.3 % (ref 11.6–15.4)
WBC: 4.2 10*3/uL (ref 3.4–10.8)

## 2018-05-17 LAB — PSA, TOTAL AND FREE
PROSTATE SPECIFIC AG, SERUM: 0.8 ng/mL (ref 0.0–4.0)
PSA FREE: 0.17 ng/mL
PSA, Free Pct: 21.3 %

## 2018-05-17 LAB — HEMOGLOBIN A1C
Est. average glucose Bld gHb Est-mCnc: 120 mg/dL
HEMOGLOBIN A1C: 5.8 % — AB (ref 4.8–5.6)

## 2018-05-17 LAB — T4, FREE: Free T4: 1.37 ng/dL (ref 0.82–1.77)

## 2018-05-17 LAB — VITAMIN D 25 HYDROXY (VIT D DEFICIENCY, FRACTURES): Vit D, 25-Hydroxy: 26.7 ng/mL — ABNORMAL LOW (ref 30.0–100.0)

## 2018-05-17 LAB — TSH: TSH: 1.05 u[IU]/mL (ref 0.450–4.500)

## 2018-05-19 ENCOUNTER — Encounter: Payer: Self-pay | Admitting: Family Medicine

## 2018-05-19 ENCOUNTER — Ambulatory Visit (INDEPENDENT_AMBULATORY_CARE_PROVIDER_SITE_OTHER): Payer: No Typology Code available for payment source | Admitting: Family Medicine

## 2018-05-19 VITALS — BP 130/90 | HR 102 | Temp 98.0°F | Ht 75.0 in | Wt 277.8 lb

## 2018-05-19 DIAGNOSIS — R7303 Prediabetes: Secondary | ICD-10-CM | POA: Diagnosis not present

## 2018-05-19 DIAGNOSIS — R7989 Other specified abnormal findings of blood chemistry: Secondary | ICD-10-CM

## 2018-05-19 DIAGNOSIS — E559 Vitamin D deficiency, unspecified: Secondary | ICD-10-CM

## 2018-05-19 DIAGNOSIS — G5793 Unspecified mononeuropathy of bilateral lower limbs: Secondary | ICD-10-CM

## 2018-05-19 DIAGNOSIS — I998 Other disorder of circulatory system: Secondary | ICD-10-CM | POA: Diagnosis not present

## 2018-05-19 DIAGNOSIS — Z9889 Other specified postprocedural states: Secondary | ICD-10-CM

## 2018-05-19 DIAGNOSIS — J329 Chronic sinusitis, unspecified: Secondary | ICD-10-CM

## 2018-05-19 DIAGNOSIS — J3089 Other allergic rhinitis: Secondary | ICD-10-CM

## 2018-05-19 DIAGNOSIS — Z6835 Body mass index (BMI) 35.0-35.9, adult: Secondary | ICD-10-CM

## 2018-05-19 MED ORDER — VITAMIN D3 125 MCG (5000 UT) PO TABS: ORAL_TABLET | ORAL | 3 refills | Status: AC

## 2018-05-19 MED ORDER — FLUTICASONE PROPIONATE 50 MCG/ACT NA SUSP: NASAL | 4 refills | Status: DC

## 2018-05-19 MED ORDER — FEXOFENADINE HCL 180 MG PO TABS: 180.0000 mg | ORAL_TABLET | Freq: Every day | ORAL | 3 refills | Status: DC

## 2018-05-19 NOTE — Progress Notes (Signed)
Assessment and plan:  1. Prediabetes- new onset   2. Poorly controlled blood pressure-  declines meds, prefers 4 additional months of lifestyle change   3. Neuropathy involving both lower extremities- gastroc. and distally-  new onset   4. Elevated serum creatinine/ renal insuff-    new onset   5. BMI 35.0-35.9,adult   6. Vitamin D insufficiency-  new onset   7. Environmental and seasonal allergies   8. Chronic sinusitis, unspecified location   9. History of sinus surgery      1. Poorly Controlled Blood Pressure - Declines Meds, Prefers 4 Months of Lifestyle Change - Blood pressure remains elevated at this time. - Discussed goal blood pressure as 130/80 or less, but under 135/85 as a starting point.  - Patient declines starting a blood pressure medication today.  States that he wants to make lifestyle changes before beginning medication, to "make sure he's done everything he's supposed to do."  - Patient comments that if he can't keep his blood pressure down within several months, states he will do whatever he is told to do in terms of starting medications.  - To treat his blood pressure with lifestyle changes, recommended patient to up his cardiovascular activity to at least 150-300 minutes per week.  - Advised patient to increase hydration and engage in DASH diet.  - Continued ambulatory blood pressure monitoring encouraged.   2. Lower Extremity Neuropathy, Involving both Lower Extremities - Discussed possible causes of nerve pain, including the fact that it may be caused by elevated blood sugars, vit def or nerve impingement in the back.  - Labs for B12, B1, magnesium, and phosphorous drawn/added to evaluate patient's complaints of BLE tingling.  -  As patient's recent round of prednisone treatment for his sinus pain did not alleviate his tingling in the lower extremities, nerve impingement is less likley.  -  Discussed that if patient's symptoms do not improve with increased exercise, alongside the prudent dietary and lifestyle changes patient plans to make to control blood sugar and blood pressure, patient knows to call in and ask for a referral to a specialist.   3. Sinus Concerns, History of Surgery, Environmental & Seasonal Allergies - STRONGLY advised the patient to begin using AYR or Neilmed sinus rinses BID followed by flonase BID (one spray to each nostril).  Advised that the patient may also incorporate allegra PRN.  - Advised patient to continue wearing n95 masks and flushing his sinuses after he has experienced allergen exposures, such as mowing the lawn.  - If after a couple of months, patient is not feeling better, we will consider adding Singulair to treatment plan.  - Per patient, states he needs referral to ENT locally given historical concerns about sinuses and history of deviated septum.  Referral to ENT provided today.  - If patient calls and explains that his sinus pain is terrible, we will consider another round of steroids in the future.  4. Reviewed recent lab work (05/16/2018) in depth with patient today.  All lab work within normal limits unless otherwise noted.  Extensive education provided.  All questions were answered.  5. Elevated Serum Creatinine, at 1.12 - Discussed that serum creatinine should be under 1.0 at patient's age. - Reviewed critical need to control blood pressure and blood sugar.  - To help improve kidney and overall organ health, avoid nephrotoxic substances, increase hydration, and engage in regular physical activity to improve blood flow to all areas of  the body.  - Strongly emphasized importance of adequate intake daily of water.  6. Vitamin D Deficiency - Begin daily OTC supplementation at 2000-5000 IU's. - Re-check in 4-6 months.  7. Elevated A1c - 5.8, Prediabetes - Reviewed prediabetes status extensively with patient today.  - Counseled  patient on prevention of disease and discussed prudent dietary and lifestyle modifications as first line of defense.  Importance of low carb/ketogenic diet discussed with patient in addition to regular exercise.   8. Lipid Panel - WNL - Lipid panel within normal limits at this time.  - Discussed that patient's HDL should be 50 or above for men, and 60 or above is ideal. - Reviewed that regular cardio will help to increase patient's HDL cholesterol value.  - Ongoing prudent dietary habits such as low saturated & trans fat and low carb/ ketogenic diets discussed with patient.  Encouraged regular exercise and weight loss when appropriate.   9. BMI Counseling - BMI of 34.72 Explained to patient what BMI refers to, and what it means medically.  Told patient to think about it as a "medical risk stratification measurement" and how increasing BMI is associated with increasing risk/ or worsening state of various diseases such as hypertension, hyperlipidemia, diabetes, premature OA, depression etc.  American Heart Association guidelines for healthy diet, basically Mediterranean diet, and exercise guidelines of 30 minutes 5 days per week or more discussed in detail.  Health counseling performed.  All questions answered.  10. Lifestyle & Preventative Health Maintenance - Advised patient to continue working toward exercising to improve overall mental, physical, and emotional health.    - Encouraged patient to engage in daily physical activity, especially a formal exercise routine.  Recommended that the patient eventually strive for at least 150 minutes of moderate cardiovascular activity per week according to guidelines established by the [facility].   - Healthy dietary habits encouraged, including low-carb, and high amounts of lean protein in diet.   - Patient should also consume adequate amounts of water.   Education and routine counseling performed. Handouts provided.  Pt was interviewed and evaluated by  me in the clinic today for 32.5+ minutes, with over 50% time spent in face to face counseling of patients various medical conditions, treatment plans of those medical conditions including medicine management and lifestyle modification, strategies to improve health and well being; and in coordination of care. SEE ABOVE TREATMENT PLAN FOR DETAILS   Orders Placed This Encounter  Procedures  . Vitamin B1  . B12  . Magnesium  . Phosphorus  . Ambulatory referral to ENT    Meds ordered this encounter  Medications  . Cholecalciferol (VITAMIN D3) 125 MCG (5000 UT) TABS    Sig: 5,000 IU OTC vitamin D3 daily.    Dispense:  90 tablet    Refill:  3  . fluticasone (FLONASE) 50 MCG/ACT nasal spray    Sig: 1 spray 2 times daily after neil med or AYR sinus rinses    Dispense:  16 g    Refill:  4  . fexofenadine (ALLEGRA) 180 MG tablet    Sig: Take 1 tablet (180 mg total) by mouth daily.    Dispense:  90 tablet    Refill:  3    Medications Discontinued During This Encounter  Medication Reason  . lidocaine (LIDODERM) 5 % Completed Course  . valACYclovir (VALTREX) 1000 MG tablet Completed Course      Return for 2) f/up 41mo for Bp, renal insuf, pre-diab, lifestyle changes/ wt loss,  neuropathy.   Anticipatory guidance and routine counseling done re: condition, txmnt options and need for follow up. All questions of patient's were answered.   Gross side effects, risk and benefits, and alternatives of medications discussed with patient.  Patient is aware that all medications have potential side effects and we are unable to predict every sideeffect or drug-drug interaction that may occur.  Expresses verbal understanding and consents to current therapy plan and treatment regiment.  Please see AVS handed out to patient at the end of our visit for additional patient instructions/ counseling done pertaining to today's office visit.  Note:  This document was prepared using Dragon voice recognition  software and may include unintentional dictation errors.   This document serves as a record of services personally performed by Thomasene Lot, DO. It was created on her behalf by Peggye Fothergill, a trained medical scribe. The creation of this record is based on the scribe's personal observations and the provider's statements to them.   I have reviewed the above medical documentation for accuracy and completeness and I concur.  Thomasene Lot, DO 05/19/2018 10:46 AM      ----------------------------------------------------------------------------------------------------------------------  Subjective:   CC:   Warren Herrera is a 43 y.o. male who presents to [facility] today for review and discussion of recent bloodwork that was done in addition to f/up on chronic conditions we are managing for pt.  1. All recent blood work that we ordered was reviewed with patient today.  Patient was counseled on all abnormalities and we discussed dietary and lifestyle changes that could help those values (also medications when appropriate).  Extensive health counseling performed and all patient's concerns/ questions were addressed.  See labs below and also plan for more details of these abnormalities  Tingling in Lower Extremities Patient recently started having tingling in his lower legs, down to his feet, about two weeks ago.  States that this is "very abnormal for me."  Isn't sure how these symptoms began.  Notes it's been "very alarming to me," and that one night it was so bad that he had to elevate his feet.  The pain has since alleviated, but he continues having tingling in his feet.  Denies back pain, but states he does have a history of healed back injury.  The pain did not improve while patient was treated on prednisone.  Chronic Sinusitis & Chronic Allergies, History of Surgery for Deviated Septum States that his use of Claritin D daily is "almost a must-have."   He is not doing sinus rinses daily.  Notes he does use Flonase daily, but it doesn't always work.  He has chronic sinusitis and chronic allergies, and has had surgery to treat this in the past.  States that his septum has deviated again and he needs a referral to ENT again.  Notes he experienced unbearable nerve pain related to his sinuses last Monday and was prescribed prednisone.  1. HTN HPI:  -  His blood pressure has not been optimally controlled at home.  Pt has been checking it at home.  States he has been averaging at 140-145/80.  Notes that he checks his blood pressure about twice per week.  States he has been trying to eat better and has noticed an improvement in his blood pressure since.  He has lost five pounds, and has been engaging in more cardio over the last month/month and a half.  He states he does two miles of exercise about 3-4  times per week.  He feels he is hitting about 120 minutes of cardiovascular activity per week.  States that his blood pressure was running 145-150/90 when he was under acute stress and before his pointed lifestyle changes, and is now averaging at 135-140/85-90 over the past month.  - Patient reports good compliance with blood pressure medications  - Denies medication S-E   - Smoking Status noted   - He denies new onset of: chest pain, exercise intolerance, shortness of breath, dizziness, visual changes, headache, lower extremity swelling or claudication.   Last 3 blood pressure readings in our office are as follows: BP Readings from Last 3 Encounters:  05/19/18 130/90  03/11/18 (!) 142/94  02/26/18 (!) 160/104    Filed Weights   05/19/18 0901  Weight: 277 lb 12.8 oz (126 kg)    Wt Readings from Last 3 Encounters:  05/19/18 277 lb 12.8 oz (126 kg)  03/11/18 282 lb (127.9 kg)  02/26/18 283 lb (128.4 kg)   BP Readings from Last 3 Encounters:  05/19/18 130/90  03/11/18 (!) 142/94  02/26/18 (!) 160/104   Pulse Readings from Last 3  Encounters:  05/19/18 (!) 102  03/11/18 88  02/26/18 79   BMI Readings from Last 3 Encounters:  05/19/18 34.72 kg/m  03/11/18 35.25 kg/m  02/26/18 35.37 kg/m     Patient Care Team    Relationship Specialty Notifications Start End  Thomasene Lot, DO PCP - General Family Medicine  11/25/17   Tracey Harries, MD Referring Physician Family Medicine  03/11/18     Full medical history updated and reviewed in the office today  Patient Active Problem List   Diagnosis Date Noted  . Neuropathy involving both lower extremities- gastroc. and distally 05/19/2018    Priority: High  . Prediabetes 05/19/2018    Priority: High  . Poorly controlled blood pressure-  declines meds, prefers 4 additional months of lifestyle change 05/19/2018    Priority: High  . Elevated serum creatinine 05/19/2018    Priority: High  . Environmental and seasonal allergies 05/19/2018    Priority: Medium  . BMI 35.0-35.9,adult 03/11/2018    Priority: Medium  . Chronic sinusitis 05/19/2018    Priority: Low  . Vitamin D insufficiency 05/19/2018    Priority: Low  . Postherpetic neuralgia at T3-T5 level- R post chest wall 03/11/2018    Priority: Low  . History of sinus surgery 05/19/2018  . Elevated blood-pressure reading without diagnosis of hypertension 08/14/2015    Past Medical History:  Diagnosis Date  . Allergy     Past Surgical History:  Procedure Laterality Date  . NASAL SINUS SURGERY      Social History   Tobacco Use  . Smoking status: Former Smoker    Packs/day: 1.00    Years: 4.00    Pack years: 4.00    Types: Cigarettes    Last attempt to quit: 2004    Years since quitting: 16.1  . Smokeless tobacco: Never Used  Substance Use Topics  . Alcohol use: No    Family Hx: Family History  Problem Relation Age of Onset  . Hypertension Mother   . Thyroid disease Mother      Medications: Current Outpatient Medications  Medication Sig Dispense Refill  . loratadine-pseudoephedrine  (CLARITIN-D 12-HOUR) 5-120 MG tablet Take 1 tablet by mouth 2 (two) times daily.    . predniSONE (DELTASONE) 20 MG tablet Take 1 tablet by mouth daily.    . pregabalin (LYRICA) 75 MG capsule Take 1  capsule (75 mg total) by mouth 2 (two) times daily. 60 capsule 3  . Cholecalciferol (VITAMIN D3) 125 MCG (5000 UT) TABS 5,000 IU OTC vitamin D3 daily. 90 tablet 3  . fexofenadine (ALLEGRA) 180 MG tablet Take 1 tablet (180 mg total) by mouth daily. 90 tablet 3  . fluticasone (FLONASE) 50 MCG/ACT nasal spray 1 spray 2 times daily after neil med or AYR sinus rinses 16 g 4   No current facility-administered medications for this visit.     Allergies:  No Known Allergies   Review of Systems: General:   No F/C, wt loss Pulm:   No DIB, SOB, pleuritic chest pain Card:  No CP, palpitations Abd:  No n/v/d or pain Ext:  No inc edema from baseline  Objective:  Blood pressure 130/90, pulse (!) 102, temperature 98 F (36.7 C), height 6\' 3"  (1.905 m), weight 277 lb 12.8 oz (126 kg), SpO2 98 %. Body mass index is 34.72 kg/m. Gen:   Well NAD, A and O *3 HEENT:    [place]/AT, EOMI,  MMM Lungs:   Normal work of breathing. CTA B/L, no Wh, rhonchi Heart:   RRR, S1, S2 WNL's, no MRG Abd:   No gross distention Exts:    warm, pink,  Brisk capillary refill, warm and well perfused.  Psych:    No HI/SI, judgement and insight good, Euthymic mood. Full Affect.   Recent Results (from the past 2160 hour(s))  PSA, total and free     Status: None   Collection Time: 05/16/18  8:55 AM  Result Value Ref Range   Prostate Specific Ag, Serum 0.8 0.0 - 4.0 ng/mL    Comment: Roche ECLIA methodology. According to the American Urological Association, Serum PSA should decrease and remain at undetectable levels after radical prostatectomy. The AUA defines biochemical recurrence as an initial PSA value 0.2 ng/mL or greater followed by a subsequent confirmatory PSA value 0.2 ng/mL or greater. Values obtained with different assay  methods or kits cannot be used interchangeably. Results cannot be interpreted as absolute evidence of the presence or absence of malignant disease.    PSA, Free 0.17 N/A ng/mL    Comment: Roche ECLIA methodology.   PSA, Free Pct 21.3 %    Comment: The table below lists the probability of prostate cancer for men with non-suspicious DRE results and total PSA between 4 and 10 ng/mL, by patient age Damaris Schooner(Catalona et al, JAMA 1998, [id number]).                   % Free PSA       50-64 yr        65-75 yr                   0.00-10.00%        56%             55%                  10.01-15.00%        24%             35%                  15.01-20.00%        17%             23%                  20.01-25.00%  10%             20%                       >25.00%         5%              9% Please note:  Catalona et al did not make specific               recommendations regarding the use of               percent free PSA for any other population               of men.   T4, free     Status: None   Collection Time: 05/16/18  8:55 AM  Result Value Ref Range   Free T4 1.37 0.82 - 1.77 ng/dL  TSH     Status: None   Collection Time: 05/16/18  8:55 AM  Result Value Ref Range   TSH 1.050 0.450 - 4.500 uIU/mL  VITAMIN D 25 Hydroxy (Vit-D Deficiency, Fractures)     Status: Abnormal   Collection Time: 05/16/18  8:55 AM  Result Value Ref Range   Vit D, 25-Hydroxy 26.7 (L) 30.0 - 100.0 ng/mL    Comment: Vitamin D deficiency has been defined by the Institute of Medicine and an Endocrine Society practice guideline as a level of serum 25-OH vitamin D less than 20 ng/mL (1,2). The Endocrine Society went on to further define vitamin D insufficiency as a level between 21 and 29 ng/mL (2). 1. IOM (Institute of Medicine). 2010. Dietary reference    intakes for calcium and D. [place]: The    [employer]. 2. Holick MF, Binkley Minerva, Bischoff-Ferrari HA, et al.    Evaluation, treatment, and  prevention of vitamin D    deficiency: an Endocrine Society clinical practice    guideline. JCEM. 2011 Jul; 96(7):1911-30.   Lipid panel     Status: None   Collection Time: 05/16/18  8:55 AM  Result Value Ref Range   Cholesterol, Total 140 100 - 199 mg/dL   Triglycerides 44 0 - 149 mg/dL   HDL 50 >60 mg/dL   VLDL Cholesterol Cal 9 5 - 40 mg/dL   LDL Calculated 81 0 - 99 mg/dL   Chol/HDL Ratio 2.8 0.0 - 5.0 ratio    Comment:                                   T. Chol/HDL Ratio                                             Men  Women                               1/2 Avg.Risk  3.4    3.3                                   Avg.Risk  5.0    4.4  2X Avg.Risk  9.6    7.1                                3X Avg.Risk 23.4   11.0   Hemoglobin A1c     Status: Abnormal   Collection Time: 05/16/18  8:55 AM  Result Value Ref Range   Hgb A1c MFr Bld 5.8 (H) 4.8 - 5.6 %    Comment:          Prediabetes: 5.7 - 6.4          Diabetes: >6.4          Glycemic control for adults with diabetes: <7.0    Est. average glucose Bld gHb Est-mCnc 120 mg/dL  Comprehensive metabolic panel     Status: None   Collection Time: 05/16/18  8:55 AM  Result Value Ref Range   Glucose 94 65 - 99 mg/dL   BUN 12 6 - 24 mg/dL   Creatinine, Ser 1.61 0.76 - 1.27 mg/dL   GFR calc non Af Amer 81 >59 mL/min/1.73   GFR calc Af Amer 94 >59 mL/min/1.73   BUN/Creatinine Ratio 11 9 - 20   Sodium 141 134 - 144 mmol/L   Potassium 4.5 3.5 - 5.2 mmol/L   Chloride 104 96 - 106 mmol/L   CO2 24 20 - 29 mmol/L   Calcium 9.1 8.7 - 10.2 mg/dL   Total Protein 6.5 6.0 - 8.5 g/dL   Albumin 4.1 4.0 - 5.0 g/dL    Comment:               **Please note reference interval change**   Globulin, Total 2.4 1.5 - 4.5 g/dL   Albumin/Globulin Ratio 1.7 1.2 - 2.2   Bilirubin Total 0.2 0.0 - 1.2 mg/dL   Alkaline Phosphatase 61 39 - 117 IU/L   AST 23 0 - 40 IU/L   ALT 36 0 - 44 IU/L  CBC with Differential/Platelet      Status: None   Collection Time: 05/16/18  8:55 AM  Result Value Ref Range   WBC 4.2 3.4 - 10.8 x10E3/uL   RBC 4.91 4.14 - 5.80 x10E6/uL   Hemoglobin 13.6 13.0 - 17.7 g/dL   Hematocrit 09.6 04.5 - 51.0 %   MCV 83 79 - 97 fL   MCH 27.7 26.6 - 33.0 pg   MCHC 33.4 31.5 - 35.7 g/dL   RDW 40.9 81.1 - 91.4 %   Platelets 223 150 - 450 x10E3/uL   Neutrophils 40 Not Estab. %   Lymphs 47 Not Estab. %   Monocytes 8 Not Estab. %   Eos 4 Not Estab. %   Basos 1 Not Estab. %   Neutrophils Absolute 1.7 1.4 - 7.0 x10E3/uL   Lymphocytes Absolute 1.9 0.7 - 3.1 x10E3/uL   Monocytes Absolute 0.3 0.1 - 0.9 x10E3/uL   EOS (ABSOLUTE) 0.2 0.0 - 0.4 x10E3/uL   Basophils Absolute 0.0 0.0 - 0.2 x10E3/uL   Immature Granulocytes 0 Not Estab. %   Immature Grans (Abs) 0.0 0.0 - 0.1 x10E3/uL

## 2018-05-19 NOTE — Patient Instructions (Addendum)
Please check your blood pressure 2 to 3 days/week and bring in your log next office visit.  Your blood pressure should be running ideally 130/80 or less but definitely less than 135/85 consistently.  Please follow-up in 3 months to see how you are doing with all this.   Check out the DASH diet = 1.5 Gram Low Sodium Diet   A 1.5 gram sodium diet restricts the amount of sodium in the diet to no more than 1.5 g or 1500 mg daily.  The American Heart Association recommends Americans over the age of 65 to consume no more than 1500 mg of sodium each day to reduce the risk of developing high blood pressure.  Research also shows that limiting sodium may reduce heart attack and stroke risk.  Many foods contain sodium for flavor and sometimes as a preservative.  When the amount of sodium in a diet needs to be low, it is important to know what to look for when choosing foods and drinks.  The following includes some information and guidelines to help make it easier for you to adapt to a low sodium diet.    QUICK TIPS  Do not add salt to food.  Avoid convenience items and fast food.  Choose unsalted snack foods.  Buy lower sodium products, often labeled as "lower sodium" or "no salt added."  Check food labels to learn how much sodium is in 1 serving.  When eating at a restaurant, ask that your food be prepared with less salt or none, if possible.    READING FOOD LABELS FOR SODIUM INFORMATION  The nutrition facts label is a good place to find how much sodium is in foods. Look for products with no more than 400 mg of sodium per serving.  Remember that 1.5 g = 1500 mg.  The food label may also list foods as:  Sodium-free: Less than 5 mg in a serving.  Very low sodium: 35 mg or less in a serving.  Low-sodium: 140 mg or less in a serving.  Light in sodium: 50% less sodium in a serving. For example, if a food that usually has 300 mg of sodium is changed to become light in sodium, it will have 150 mg of sodium.   Reduced sodium: 25% less sodium in a serving. For example, if a food that usually has 400 mg of sodium is changed to reduced sodium, it will have 300 mg of sodium.    CHOOSING FOODS  Grains  Avoid: Salted crackers and snack items. Some cereals, including instant hot cereals. Bread stuffing and biscuit mixes. Seasoned rice or pasta mixes.  Choose: Unsalted snack items. Low-sodium cereals, oats, puffed wheat and rice, shredded wheat. English muffins and bread. Pasta.  Meats  Avoid: Salted, canned, smoked, spiced, pickled meats, including fish and poultry. Bacon, ham, sausage, cold cuts, hot dogs, anchovies.  Choose: Low-sodium canned tuna and salmon. Fresh or frozen meat, poultry, and fish.  Dairy  Avoid: Processed cheese and spreads. Cottage cheese. Buttermilk and condensed milk. Regular cheese.  Choose: Milk. Low-sodium cottage cheese. Yogurt. Sour cream. Low-sodium cheese.  Fruits and Vegetables  Avoid: Regular canned vegetables. Regular canned tomato sauce and paste. Frozen vegetables in sauces. Olives. Rosita Fire. Relishes. Sauerkraut.  Choose: Low-sodium canned vegetables. Low-sodium tomato sauce and paste. Frozen or fresh vegetables. Fresh and frozen fruit.  Condiments  Avoid: Canned and packaged gravies. Worcestershire sauce. Tartar sauce. Barbecue sauce. Soy sauce. Steak sauce. Ketchup. Onion, garlic, and table salt. Meat flavorings and tenderizers.  Choose: Fresh and dried herbs and spices. Low-sodium varieties of mustard and ketchup. Lemon juice. Tabasco sauce. Horseradish.    SAMPLE 1.5 GRAM SODIUM MEAL PLAN:   Breakfast / Sodium (mg)  1 cup low-fat milk / 143 mg  1 whole-wheat English muffin / 240 mg  1 tbs heart-healthy margarine / 153 mg  1 hard-boiled egg / 139 mg  1 small orange / 0 mg  Lunch / Sodium (mg)  1 cup raw carrots / 76 mg  2 tbs no salt added peanut butter / 5 mg  2 slices whole-wheat bread / 270 mg  1 tbs jelly / 6 mg   cup red grapes / 2 mg  Dinner /  Sodium (mg)  1 cup whole-wheat pasta / 2 mg  1 cup low-sodium tomato sauce / 73 mg  3 oz lean ground beef / 57 mg  1 small side salad (1 cup raw spinach leaves,  cup cucumber,  cup yellow bell pepper) with 1 tsp olive oil and 1 tsp red wine vinegar / 25 mg  Snack / Sodium (mg)  1 container low-fat vanilla yogurt / 107 mg  3 graham cracker squares / 127 mg  Nutrient Analysis  Calories: 1745  Protein: 75 g  Carbohydrate: 237 g  Fat: 57 g  Sodium: 1425 mg  Document Released: 03/23/2005 Document Revised: 12/03/2010 Document Reviewed: 06/24/2009  [facility] Patient Information 2012 [place], [place].   Risk factors for prediabetes and type 2 diabetes  Researchers don't fully understand why some people develop prediabetes and type 2 diabetes and others don't.  It's clear that certain factors increase the risk, however, including:  Weight. The more fatty tissue you have, the more resistant your cells become to insulin.  Inactivity. The less active you are, the greater your risk. Physical activity helps you control your weight, uses up glucose as energy and makes your cells more sensitive to insulin.  Family history. Your risk increases if a parent or sibling has type 2 diabetes.  Race. Although it's unclear why, people of certain races -- including blacks, Hispanics, American Indians and Asian-Americans -- are at higher risk.  Age. Your risk increases as you get older. This may be because you tend to exercise less, lose muscle mass and gain weight as you age. But type 2 diabetes is also increasing dramatically among children, adolescents and younger adults.  Gestational diabetes. If you developed gestational diabetes when you were pregnant, your risk of developing prediabetes and type 2 diabetes later increases. If you gave birth to a baby weighing more than 9 pounds (4 kilograms), you're also at risk of type 2 diabetes.  Polycystic ovary syndrome. For women, having polycystic ovary syndrome -- a  common condition characterized by irregular menstrual periods, excess hair growth and obesity -- increases the risk of diabetes.  High blood pressure. Having blood pressure over 140/90 millimeters of mercury (mm Hg) is linked to an increased risk of type 2 diabetes.  Abnormal cholesterol and triglyceride levels. If you have low levels of high-density lipoprotein (HDL), or "good," cholesterol, your risk of type 2 diabetes is higher. Triglycerides are another type of fat carried in the blood. People with high levels of triglycerides have an increased risk of type 2 diabetes. Your doctor can let you know what your cholesterol and triglyceride levels are.  A good guide to good carbs: The glycemic index ---If you have diabetes, or at risk for diabetes, you know all too well that when you eat carbohydrates, your  blood sugar goes up. The total amount of carbs you consume at a meal or in a snack mostly determines what your blood sugar will do. But the food itself also plays a role. A serving of white rice has almost the same effect as eating pure table sugar -- a quick, high spike in blood sugar. A serving of lentils has a slower, smaller effect.  ---Picking good sources of carbs can help you control your blood sugar and your weight. Even if you don't have diabetes, eating healthier carbohydrate-rich foods can help ward off a host of chronic conditions, from heart disease to various cancers to, well, diabetes.  ---One way to choose foods is with the glycemic index (GI). This tool measures how much a food boosts blood sugar.  The glycemic index rates the effect of a specific amount of a food on blood sugar compared with the same amount of pure glucose. A food with a glycemic index of 28 boosts blood sugar only 28% as much as pure glucose. One with a GI of 95 acts like pure glucose.    High glycemic foods result in a quick spike in insulin and blood sugar (also known as blood glucose).  Low glycemic foods have a  slower, smaller effect- these are healthier for you.   Using the glycemic index Using the glycemic index is easy: choose foods in the low GI category instead of those in the high GI category (see below), and go easy on those in between. Low glycemic index (GI of 55 or less): Most fruits and vegetables, beans, minimally processed grains, pasta, low-fat dairy foods, and nuts.  Moderate glycemic index (GI 56 to 69): White and sweet potatoes, corn, white rice, couscous, breakfast cereals such as Cream of Wheat and Mini Wheats.  High glycemic index (GI of 70 or higher): White bread, rice cakes, most crackers, bagels, cakes, doughnuts, croissants, most packaged breakfast cereals. You can see the values for 100 commons foods and get links to more at [email or web address].  Swaps for lowering glycemic index  Instead of this high-glycemic index food Eat this lower-glycemic index food  White rice Brown rice or converted rice  Instant oatmeal Steel-cut oats  Cornflakes Bran flakes  Baked potato Pasta, bulgur  White bread Whole-grain bread  Corn Peas or leafy greens       Prediabetes Eating Plan  Prediabetes--also called impaired glucose tolerance or impaired fasting glucose--is a condition that causes blood sugar (blood glucose) levels to be higher than normal. Following a healthy diet can help to keep prediabetes under control. It can also help to lower the risk of type 2 diabetes and heart disease, which are increased in people who have prediabetes. Along with regular exercise, a healthy diet:  Promotes weight loss.  Helps to control blood sugar levels.  Helps to improve the way that the body uses insulin.   WHAT DO I NEED TO KNOW ABOUT THIS EATING PLAN?   Use the glycemic index (GI) to plan your meals. The index tells you how quickly a food will raise your blood sugar. Choose low-GI foods. These foods take a longer time to raise blood sugar.  Pay close attention to the amount  of carbohydrates in the food that you eat. Carbohydrates increase blood sugar levels.  Keep track of how many calories you take in. Eating the right amount of calories will help you to achieve a healthy weight. Losing about 7 percent of your starting weight can help to prevent type 2 diabetes.  You may want to follow a Mediterranean diet. This diet includes a lot of vegetables, lean meats or fish, whole grains, fruits, and healthy oils and fats.   WHAT FOODS CAN I EAT?  Grains Whole grains, such as whole-wheat or whole-grain breads, crackers, cereals, and pasta. Unsweetened oatmeal. Bulgur. Barley. Quinoa. Brown rice. Corn or whole-wheat flour tortillas or taco shells. Vegetables Lettuce. Spinach. Peas. Beets. Cauliflower. Cabbage. Broccoli. Carrots. Tomatoes. Squash. Eggplant. Herbs. Peppers. Onions. Cucumbers. Brussels sprouts. Fruits Berries. Bananas. Apples. Oranges. Grapes. Papaya. Mango. Pomegranate. Kiwi. Grapefruit. Cherries. Meats and Other Protein Sources Seafood. Lean meats, such as chicken and [place] or lean cuts of pork and beef. Tofu. Eggs. Nuts. Beans. Dairy Low-fat or fat-free dairy products, such as yogurt, cottage cheese, and cheese. Beverages Water. Tea. Coffee. Sugar-free or diet soda. Seltzer water. Milk. Milk alternatives, such as soy or almond milk. Condiments Mustard. Relish. Low-fat, low-sugar ketchup. Low-fat, low-sugar barbecue sauce. Low-fat or fat-free mayonnaise. Sweets and Desserts Sugar-free or low-fat pudding. Sugar-free or low-fat ice cream and other frozen treats. Fats and Oils Avocado. Walnuts. Olive oil. The items listed above may not be a complete list of recommended foods or beverages. Contact your dietitian for more options.    WHAT FOODS ARE NOT RECOMMENDED?  Grains Refined white flour and flour products, such as bread, pasta, snack foods, and cereals. Beverages Sweetened drinks, such as sweet iced tea and soda. Sweets and Desserts Baked  goods, such as cake, cupcakes, pastries, cookies, and cheesecake. The items listed above may not be a complete list of foods and beverages to avoid. Contact your dietitian for more information.   This information is not intended to replace advice given to you by your health care provider. Make sure you discuss any questions you have with your health care provider.   Document Released: 08/07/2014 Document Reviewed: 08/07/2014 Elsevier Interactive Patient Education 2016 [employer]   Sinus Concerns Daily sterile saline nasal rinses, such as Lloyd HugerNeil med or AYR sinus rinses, can be very helpful and should be done twice daily- especially throughout the allergy season.   Remember you should use distilled water or previously boiled water to do this.  Then you may use over-the-counter Flonase 1 spray each nostril twice daily after sinus rinses.  You can do this in addition to taking generic Allegra daily.  Continue wearing n95 masks and flush your sinuses after you have experienced allergen exposures, such as mowing the lawn.  If after a couple of months, you are not feeling better, we will consider adding Singulair to your treatment plan.  If your eyes tend to get an itchy or irritated feeling when your seasonal allergies get bad, you can use Naphcon-A over-the-counter eyedrops as needed

## 2018-05-23 LAB — MAGNESIUM: MAGNESIUM: 2.3 mg/dL (ref 1.6–2.3)

## 2018-05-23 LAB — VITAMIN B12: VITAMIN B 12: 1933 pg/mL — AB (ref 232–1245)

## 2018-05-23 LAB — PHOSPHORUS: PHOSPHORUS: 2.8 mg/dL (ref 2.8–4.1)

## 2018-05-23 LAB — VITAMIN B1: Thiamine: 124.8 nmol/L (ref 66.5–200.0)

## 2018-05-24 ENCOUNTER — Other Ambulatory Visit: Payer: Self-pay

## 2018-05-24 MED ORDER — ONE-A-DAY MENS PO TABS: 1.0000 | ORAL_TABLET | Freq: Every day | ORAL | 0 refills | Status: AC

## 2018-08-17 ENCOUNTER — Ambulatory Visit: Payer: PRIVATE HEALTH INSURANCE | Admitting: Family Medicine

## 2018-11-12 LAB — NOVEL CORONAVIRUS, NAA: SARS CoV2 RNA: NEGATIVE

## 2019-09-13 ENCOUNTER — Ambulatory Visit: Payer: PRIVATE HEALTH INSURANCE | Admitting: Physician Assistant

## 2019-11-17 ENCOUNTER — Encounter: Payer: Self-pay | Admitting: Physician Assistant

## 2019-11-17 ENCOUNTER — Other Ambulatory Visit: Payer: Self-pay

## 2019-11-17 ENCOUNTER — Ambulatory Visit (INDEPENDENT_AMBULATORY_CARE_PROVIDER_SITE_OTHER): Payer: 59 | Admitting: Physician Assistant

## 2019-11-17 VITALS — BP 170/80 | HR 91 | Temp 97.9°F | Ht 75.0 in | Wt 254.8 lb

## 2019-11-17 DIAGNOSIS — F4323 Adjustment disorder with mixed anxiety and depressed mood: Secondary | ICD-10-CM | POA: Diagnosis not present

## 2019-11-17 DIAGNOSIS — R7303 Prediabetes: Secondary | ICD-10-CM

## 2019-11-17 DIAGNOSIS — E669 Obesity, unspecified: Secondary | ICD-10-CM

## 2019-11-17 DIAGNOSIS — I998 Other disorder of circulatory system: Secondary | ICD-10-CM | POA: Diagnosis not present

## 2019-11-17 DIAGNOSIS — Z6831 Body mass index (BMI) 31.0-31.9, adult: Secondary | ICD-10-CM

## 2019-11-17 DIAGNOSIS — Z3009 Encounter for other general counseling and advice on contraception: Secondary | ICD-10-CM

## 2019-11-17 DIAGNOSIS — B0229 Other postherpetic nervous system involvement: Secondary | ICD-10-CM

## 2019-11-17 MED ORDER — PREGABALIN 75 MG PO CAPS: 75.0000 mg | ORAL_CAPSULE | Freq: Two times a day (BID) | ORAL | 1 refills | Status: DC

## 2019-11-17 MED ORDER — SERTRALINE HCL 25 MG PO TABS: 25.0000 mg | ORAL_TABLET | Freq: Every day | ORAL | 1 refills | Status: DC

## 2019-11-17 NOTE — Patient Instructions (Signed)
Postherpetic Neuralgia Postherpetic neuralgia (PHN) is nerve pain that occurs after a shingles infection. Shingles is a painful rash that appears on one area of the body, usually on the trunk or face. Shingles is caused by the varicella-zoster virus. This is the same virus that causes chickenpox. In people who have had chickenpox, the virus can resurface years later and cause shingles. You may have PHN if you continue to have pain for 4 months after your shingles rash has gone away. PHN appears in the same area where you had the shingles rash. The pain usually goes away after the rash disappears. Getting a vaccination for shingles can prevent PHN. This vaccine is recommended for people older than 60. It may prevent shingles, and may also lower your risk of PHN if you do get shingles. What are the causes? This condition is caused by damage to your nerves from the varicella-zoster virus. The damage makes your nerves overly sensitive. What increases the risk? The following factors may make you more likely to develop this condition:  Being older than 43 years of age.  Having severe pain before your shingles rash starts.  Having a severe rash.  Having shingles in and around the eye area.  Having a disease that makes your body unable to fight infections (weak immune system). What are the signs or symptoms? The main symptom of this condition is pain. The pain may:  Often be very bad and may be described as stabbing, burning, or feeling like an electric shock.  Come and go or may be there all the time.  Be triggered by light touches on the skin or changes in temperature. You may have itching along with the pain. How is this diagnosed? This condition may be diagnosed based on your symptoms and your history of shingles. Lab studies and other diagnostic tests are usually not needed. How is this treated? There is no cure for this condition. Treatment for PHN will focus on pain relief.  Over-the-counter pain relievers do not usually relieve PHN pain. You may need to work with a pain specialist. Treatment may include:  Antidepressant medicines to help with pain and improve sleep.  Anti-seizure medicines to relieve nerve pain.  Strong pain relievers (opioids).  A numbing patch worn on the skin (lidocaine patch).  Botox (botulinum toxin) injections to block pain signals between nerves and muscles.  Injections of numbing medicine or anti-inflammatory medicines around irritated nerves. Follow these instructions at home:   It may take a long time to recover from PHN. Work closely with your health care provider and develop a good support system at home.  Take over-the-counter and prescription medicines only as told by your health care provider.  Do not drive or use heavy machinery while taking prescription pain medicine.  Wear loose, comfortable clothing.  Cover sensitive areas with a dressing to reduce friction from clothing rubbing on the area.  If directed, put ice on the painful area: ? Put ice in a plastic bag. ? Place a towel between your skin and the bag. ? Leave the ice on for 20 minutes, 2-3 times a day.  Talk to your health care provider if you feel depressed or desperate. Living with long-term pain can be depressing.  Keep all follow-up visits as told by your health care provider. This is important. Contact a health care provider if:  Your medicine is not helping.  You are struggling to manage your pain at home. Summary  Postherpetic neuralgia is a very painful disorder   that can occur after an episode of shingles.  The pain is often severe, burning, electric, or stabbing.  Prescription medicines can be helpful in managing persistent pain.  Getting a vaccination for shingles can prevent PHN. This vaccine is recommended for people older than 60. This information is not intended to replace advice given to you by your health care provider. Make sure  you discuss any questions you have with your health care provider. Document Revised: 03/05/2017 Document Reviewed: 06/09/2016 Elsevier Patient Education  2020 Else[employer] Inc.  

## 2019-11-17 NOTE — Assessment & Plan Note (Signed)
-  PHQ-9 score of 8 and GAD 7 score of 9 -Discussed with patient various treatment options including side effects, and agreeable to starting low-dose Sertraline. -Encouraged to continue with exercise regimen. -Follow-up in 8 weeks to reassess symptoms and medication therapy.

## 2019-11-17 NOTE — Assessment & Plan Note (Signed)
-  Increased stress most likely contributing to elevated blood pressure. -Continue ambulatory BP and pulse monitoring. -Continue dietary and lifestyle modifications. -Follow low-sodium diet. -Will continue to monitor. If BP continues to be elevated will consider starting low-dose antihypertensive.

## 2019-11-17 NOTE — Assessment & Plan Note (Signed)
-  Increased stress most likely contributing to recurrent episodes. -Provided refill of Lyrica. -Will continue to monitor.

## 2019-11-17 NOTE — Assessment & Plan Note (Signed)
-  Last A1c 5.8, stable -Follow low carbohydrate and glucose diet. -Plan to recheck A1c at next office visit.

## 2019-11-17 NOTE — Progress Notes (Signed)
Established Patient Office Visit  Subjective:  Patient ID: Warren Herrera, male    DOB: 1975-11-30  Age: 44 y.o. MRN: [id number]  CC:  Chief Complaint  Patient presents with   Hypertension   Anxiety   Depression    HPI Warren Herrera presents for follow up on hypertension, prediabetes, and weight.   HTN: Pt denies chest pain, palpitations, dizziness or lower extremity swelling. Taking medication as directed without side effects. Checks BP at home and readings range in 140-150/65-75. Pt does monitor sodium intake and states he could improve. He stays very active, exercises about 4 times/wk.  Postherpetic neuralgia: Pt reports he had an episode of shingles last year. States he never had an actual rash but did experience the pain and burning sensation. Reports recurrent episodes since then. Denies rash outbreak.   Mood: Pt reports he is experiencing increased personal stress. He is currently going through a divorce and busy with work. His exercise regimen does help improve his mood and feel better. Denies SI/HI.  Past Medical History:  Diagnosis Date   Allergy     Past Surgical History:  Procedure Laterality Date   NASAL SINUS SURGERY      Family History  Problem Relation Age of Onset   Hypertension Mother    Thyroid disease Mother     Social History   Socioeconomic History   Marital status: Married    Spouse name: Not on file   Number of children: Not on file   Years of education: Not on file   Highest education level: Not on file  Occupational History   Occupation: Minister  Tobacco Use   Smoking status: Former Smoker    Packs/day: 1.00    Years: 4.00    Pack years: 4.00    Types: Cigarettes    Quit date: 2004    Years since quitting: 17.6   Smokeless tobacco: Never Used  Building services engineer Use: Never used  Substance and Sexual Activity   Alcohol use: No   Drug use: No   Sexual activity: Yes    Birth control/protection: None  Other  Topics Concern   Not on file  Social History Narrative   Not on file   Social Determinants of Health   Financial Resource Strain:    Difficulty of Paying Living Expenses:   Food Insecurity:    Worried About Programme researcher, broadcasting/film/video in the Last Year:    Barista in the Last Year:   Transportation Needs:    Freight forwarder (Medical):    Lack of Transportation (Non-Medical):   Physical Activity:    Days of Exercise per Week:    Minutes of Exercise per Session:   Stress:    Feeling of Stress :   Social Connections:    Frequency of Communication with Friends and Family:    Frequency of Social Gatherings with Friends and Family:    Attends Religious Services:    Active Member of Clubs or Organizations:    Attends Engineer, structural:    Marital Status:   Intimate Partner Violence:    Fear of Current or Ex-Partner:    Emotionally Abused:    Physically Abused:    Sexually Abused:     Outpatient Medications Prior to Visit  Medication Sig Dispense Refill   Cholecalciferol (VITAMIN D3) 125 MCG (5000 UT) TABS 5,000 IU OTC vitamin D3 daily. 90 tablet 3   fluticasone (FLONASE) 50 MCG/ACT nasal spray  1 spray 2 times daily after neil med or AYR sinus rinses 16 g 4   loratadine-pseudoephedrine (CLARITIN-D 12-HOUR) 5-120 MG tablet Take 1 tablet by mouth 2 (two) times daily.     multivitamin (ONE-A-DAY MEN'S) TABS tablet Take 1 tablet by mouth daily. 90 tablet 0   fexofenadine (ALLEGRA) 180 MG tablet Take 1 tablet (180 mg total) by mouth daily. (Patient not taking: Reported on 11/17/2019) 90 tablet 3   predniSONE (DELTASONE) 20 MG tablet Take 1 tablet by mouth daily. (Patient not taking: Reported on 11/17/2019)     pregabalin (LYRICA) 75 MG capsule Take 1 capsule (75 mg total) by mouth 2 (two) times daily. 60 capsule 3   No facility-administered medications prior to visit.    No Known Allergies  ROS Review of Systems  A fourteen system  review of systems was performed and found to be positive as per HPI.  Objective:    Physical Exam General:  Well Developed, well nourished, appropriate for stated age.  Neuro:  Alert and oriented,  extra-ocular muscles intact  HEENT:  Normocephalic, atraumatic, neck supple Skin:  no gross rash, warm, pink. Cardiac:  RRR, S1 S2 Respiratory:  ECTA B/L and A/P, Not using accessory muscles, speaking in full sentences- unlabored. Vascular:  Ext warm, no cyanosis apprec.; cap RF less 2 sec. Psych:  No HI/SI, judgement and insight good, Euthymic mood. Normal behavior.   BP (!) 151/90    Pulse 91    Temp 97.9 F (36.6 C) (Oral)    Ht 6\' 3"  (1.905 m)    Wt 254 lb 12.8 oz (115.6 kg)    SpO2 96%    BMI 31.85 kg/m  Wt Readings from Last 3 Encounters:  11/17/19 254 lb 12.8 oz (115.6 kg)  05/19/18 277 lb 12.8 oz (126 kg)  03/11/18 282 lb (127.9 kg)     Health Maintenance Due  Topic Date Due   Hepatitis C Screening  Never done   COVID-19 Vaccine (1) Never done   HIV Screening  Never done   TETANUS/TDAP  Never done   INFLUENZA VACCINE  11/05/2019    There are no preventive care reminders to display for this patient.  Lab Results  Component Value Date   TSH 1.050 05/16/2018   Lab Results  Component Value Date   WBC 4.2 05/16/2018   HGB 13.6 05/16/2018   HCT 40.7 05/16/2018   MCV 83 05/16/2018   PLT 223 05/16/2018   Lab Results  Component Value Date   NA 141 05/16/2018   K 4.5 05/16/2018   CO2 24 05/16/2018   GLUCOSE 94 05/16/2018   BUN 12 05/16/2018   CREATININE 1.12 05/16/2018   BILITOT 0.2 05/16/2018   ALKPHOS 61 05/16/2018   AST 23 05/16/2018   ALT 36 05/16/2018   PROT 6.5 05/16/2018   ALBUMIN 4.1 05/16/2018   CALCIUM 9.1 05/16/2018   Lab Results  Component Value Date   CHOL 140 05/16/2018   Lab Results  Component Value Date   HDL 50 05/16/2018   Lab Results  Component Value Date   LDLCALC 81 05/16/2018   Lab Results  Component Value Date   TRIG 44  05/16/2018   Lab Results  Component Value Date   CHOLHDL 2.8 05/16/2018   Lab Results  Component Value Date   HGBA1C 5.8 (H) 05/16/2018      Assessment & Plan:   Problem List Items Addressed This Visit      Nervous and Auditory  Postherpetic neuralgia at T3-T5 level- R post chest wall (Chronic)    -Increased stress most likely contributing to recurrent episodes. -Provided refill of Lyrica. -Will continue to monitor.      Relevant Medications   pregabalin (LYRICA) 75 MG capsule     Other   Poorly controlled blood pressure-  declines meds, prefers 4 additional months of lifestyle change - Primary (Chronic)    -Increased stress most likely contributing to elevated blood pressure. -Continue ambulatory BP and pulse monitoring. -Continue dietary and lifestyle modifications. -Follow low-sodium diet. -Will continue to monitor. If BP continues to be elevated will consider starting low-dose antihypertensive.      Prediabetes    -Last A1c 5.8, stable -Follow low carbohydrate and glucose diet. -Plan to recheck A1c at next office visit.      Adjustment disorder with mixed anxiety and depressed mood    -PHQ-9 score of 8 and GAD 7 score of 9 -Discussed with patient various treatment options including side effects, and agreeable to starting low-dose Sertraline. -Encouraged to continue with exercise regimen. -Follow-up in 8 weeks to reassess symptoms and medication therapy.       Relevant Medications   sertraline (ZOLOFT) 25 MG tablet    Other Visit Diagnoses    Class 1 obesity with serious comorbidity and body mass index (BMI) of 31.0 to 31.9 in adult, unspecified obesity type       Vasectomy evaluation       Relevant Orders   Ambulatory referral to Urology     Class I obesity, BMI 31.85: -Patient has lost about 23 pounds since last OV. -Continue with dietary and lifestyle changes.   Meds ordered this encounter  Medications   sertraline (ZOLOFT) 25 MG tablet     Sig: Take 1 tablet (25 mg total) by mouth daily.    Dispense:  60 tablet    Refill:  1    Order Specific Question:   Supervising Provider    Answer:   Nani Gasser D [[id number]]   pregabalin (LYRICA) 75 MG capsule    Sig: Take 1 capsule (75 mg total) by mouth 2 (two) times daily.    Dispense:  60 capsule    Refill:  1    Order Specific Question:   Supervising Provider    Answer:   Nani Gasser D [[id number]]    Follow-up: Return for Mood management, HTN and FBW in 8 weeks.    Mayer Masker, PA-C

## 2019-11-24 ENCOUNTER — Other Ambulatory Visit: Payer: Self-pay

## 2019-11-24 ENCOUNTER — Ambulatory Visit (INDEPENDENT_AMBULATORY_CARE_PROVIDER_SITE_OTHER): Payer: 59 | Admitting: Urology

## 2019-11-24 ENCOUNTER — Encounter: Payer: Self-pay | Admitting: Urology

## 2019-11-24 VITALS — BP 135/89 | HR 91 | Ht 75.0 in | Wt 254.0 lb

## 2019-11-24 DIAGNOSIS — Z3009 Encounter for other general counseling and advice on contraception: Secondary | ICD-10-CM | POA: Diagnosis not present

## 2019-11-24 NOTE — Progress Notes (Signed)
11/24/2019 9:20 AM   Warren Herrera 09-May-1975 [id number]  Referring provider: Mayer Masker, PA-C [address] [place],  [place] [address]  Chief Complaint  Patient presents with   VAS Consult    HPI: 44 y.o. male presents today for vasectomy counseling.   Married with 4 children  No previous history of chronic scrotal pain, epididymitis/orchitis  No prior hernia or pelvic surgery  No history of bleeding/clotting disorders   PMH: Past Medical History:  Diagnosis Date   Allergy     Surgical History: Past Surgical History:  Procedure Laterality Date   NASAL SINUS SURGERY      Home Medications:  Allergies as of 11/24/2019   No Known Allergies     Medication List       Accurate as of November 24, 2019  9:20 AM. If you have any questions, ask your nurse or doctor.        fexofenadine 180 MG tablet Commonly known as: ALLEGRA Take 1 tablet (180 mg total) by mouth daily.   fluticasone 50 MCG/ACT nasal spray Commonly known as: FLONASE 1 spray 2 times daily after neil med or AYR sinus rinses   loratadine-pseudoephedrine 5-120 MG tablet Commonly known as: CLARITIN-D 12-hour Take 1 tablet by mouth 2 (two) times daily.   multivitamin Tabs tablet Take 1 tablet by mouth daily.   predniSONE 20 MG tablet Commonly known as: DELTASONE Take 1 tablet by mouth daily.   pregabalin 75 MG capsule Commonly known as: Lyrica Take 1 capsule (75 mg total) by mouth 2 (two) times daily.   sertraline 25 MG tablet Commonly known as: Zoloft Take 1 tablet (25 mg total) by mouth daily.   Vitamin D3 125 MCG (5000 UT) Tabs 5,000 IU OTC vitamin D3 daily.       Allergies: No Known Allergies  Family History: Family History  Problem Relation Age of Onset   Hypertension Mother    Thyroid disease Mother     Social History:  reports that he quit smoking about 17 years ago. His smoking use included cigarettes. He has a 4.00 pack-year smoking history. He  has never used smokeless tobacco. He reports that he does not drink alcohol and does not use drugs.   Physical Exam: BP 135/89    Pulse 91    Ht 6\' 3"  (1.905 m)    Wt 254 lb (115.2 kg)    BMI 31.75 kg/m   Constitutional:  Alert and oriented, No acute distress. HEENT: [place] AT, moist mucus membranes.  Trachea midline, no masses. Cardiovascular: No clubbing, cyanosis, or edema. Respiratory: Normal respiratory effort, no increased work of breathing. GU: Phallus circumcised without lesions, testes descended bilaterally without masses or tenderness, spermatic cord/epididymis palpably normal bilaterally; vasa easily palpable. Skin: No rashes, bruises or suspicious lesions. Neurologic: Grossly intact, no focal deficits, moving all 4 extremities. Psychiatric: Normal mood and affect.   Assessment & Plan:    1. Undesired fertility  Desires to schedule vasectomy  We had a long discussion about vasectomy. We specifically discussed the procedure, recovery and the risks, benefits and alternatives of vasectomy. I explained that the procedure entails removal of a segment of each vas deferens, each of which conducts sperm, and that the purpose of this procedure is to cause sterility (inability to produce children or cause pregnancy). Vasectomy is intended to be permanent and irreversible form of contraception. Options for fertility after vasectomy include vasectomy reversal, or sperm retrieval with in vitro fertilization. These options are  not always successful, and they may be expensive. We discussed reversible forms of birth control such as condoms, IUD or diaphragms, as well as the option of freezing sperm in a sperm bank prior to the vasectomy procedure. We discussed the importance of avoiding strenuous exercise for four days after vasectomy, and the importance of refraining from any form of ejaculation for seven days after vasectomy. I explained that vasectomy does not produce immediate sterility so another  form of contraceptive must be used until sterility is assured by having semen checked for sperm. Thus, a post vasectomy semen analysis is necessary to confirm sterility. Rarely, vasectomy must be repeated. We discussed the approximately 1 in 2,000 risk of pregnancy after vasectomy for men who have post-vasectomy semen analysis showing absent sperm or rare non-motile sperm. Typical side effects include a small amount of oozing blood, some discomfort and mild swelling in the area of incision.  Vasectomy does not affect sexual performance, function, please, sensation, interest, desire, satisfaction, penile erection, volume of semen or ejaculation. Other rare risks include allergy or adverse reaction to an anesthetic, testicular atrophy, hematoma, infection/abscess, prolonged tenderness of the vas deferens, pain, swelling, painful nodule or scar (called sperm granuloma) or epididymtis. We discussed chronic testicular pain syndrome. This has been reported to occur in as many as 1-2% of men and may be permanent. This can be treated with medication, small procedures or (rarely) surgery.  Requests preprocedure anxiolytic, Rx Valium sent to pharmacy. Was informed he will need a driver.    Riki Altes, MD  [facility] [address] [place], [place] [address] [phone]

## 2019-11-24 NOTE — Patient Instructions (Signed)

## 2019-11-26 ENCOUNTER — Encounter: Payer: Self-pay | Admitting: Urology

## 2019-11-26 MED ORDER — DIAZEPAM 10 MG PO TABS: ORAL_TABLET | ORAL | 0 refills | Status: DC

## 2020-01-09 ENCOUNTER — Ambulatory Visit: Payer: PRIVATE HEALTH INSURANCE | Admitting: Physician Assistant

## 2020-01-16 ENCOUNTER — Telehealth: Payer: Self-pay | Admitting: Urology

## 2020-01-16 MED ORDER — DIAZEPAM 10 MG PO TABS: ORAL_TABLET | ORAL | 0 refills | Status: DC

## 2020-01-16 NOTE — Telephone Encounter (Signed)
Patient did not get his valium for his vasectomy on Friday and wanted [place] to call it in again to the [facility].  Can you do that please?  thanks

## 2020-01-19 ENCOUNTER — Ambulatory Visit (INDEPENDENT_AMBULATORY_CARE_PROVIDER_SITE_OTHER): Payer: 59 | Admitting: Urology

## 2020-01-19 ENCOUNTER — Encounter: Payer: Self-pay | Admitting: Urology

## 2020-01-19 ENCOUNTER — Other Ambulatory Visit: Payer: Self-pay

## 2020-01-19 VITALS — BP 121/88 | HR 106 | Ht 75.0 in | Wt 247.0 lb

## 2020-01-19 DIAGNOSIS — Z302 Encounter for sterilization: Secondary | ICD-10-CM

## 2020-01-19 MED ORDER — HYDROCODONE-ACETAMINOPHEN 5-325 MG PO TABS: 1.0000 | ORAL_TABLET | Freq: Four times a day (QID) | ORAL | 0 refills | Status: DC | PRN

## 2020-01-19 NOTE — Patient Instructions (Signed)

## 2020-01-19 NOTE — Progress Notes (Signed)
Vasectomy Procedure Note  Indications: The patient is a 44 y.o. male who presents today for elective sterilization.  He has been consented for the procedure.  He is aware of the risks and benefits.  He had no additional questions.  He agrees to proceed.  He denies any other significant change since his last visit.  Pre-operative Diagnosis: Elective sterilization  Post-operative Diagnosis: Elective sterilization  Premedication: Valium 10 mg po  Surgeon: Kamilya Wakeman C. Jaydin Jalomo, M.D  Description: The patient was prepped and draped in the standard fashion.  The right vas deferens was identified and brought superiorly to the anterior scrotal skin.  The skin and vas was then anesthetized utilizing 6 ml 1% lidocaine.  A small stab incision was made and spread with the vas dissector.  The vas was grasped utilizing the vas clamp and elevated out of the incision.  The vas was dissected free from surrounding tissue and vessels and an ~1 cm segment was excised.  The vas lumens were cauterized utilizing electrocautery.  The distal segment was buried in the surrounding sheath with a 3-0 chromic suture.  No significant bleeding was observed.  The vas ends were then dropped back into the hemiscrotum.  The skin was closed with hemostatic pressure.  An identical procedure was performed on the contralateral side.  Clean dry gauze was applied to the incision sites.  The patient tolerated the procedure well.  Complications:None  Recommendations: 1.  No lifting greater than 10 pounds or strenuousactivity for 1 week. 2.  Scrotal support for 1 week. 3.  Shower only for 1 week; may shower in the morning 4.  May resume intercourse in one week if no significant discomfort.  Continue alternate contraception for 12 weeks.  5.  Call for significant pain, swelling, redness, drainage or fever greater than 100.5. 6.  Rx hydrocodone/APAP 5/325 1-2 every 6 hours as needed for pain. 7.  Follow-up semen analysis in 12  weeks.   Aniqa Hare, MD 

## 2020-02-23 ENCOUNTER — Other Ambulatory Visit: Payer: Self-pay

## 2020-02-23 ENCOUNTER — Ambulatory Visit (INDEPENDENT_AMBULATORY_CARE_PROVIDER_SITE_OTHER): Payer: 59 | Admitting: Physician Assistant

## 2020-02-23 ENCOUNTER — Encounter: Payer: Self-pay | Admitting: Physician Assistant

## 2020-02-23 VITALS — BP 128/82 | HR 77 | Temp 98.4°F | Ht 75.0 in | Wt 260.3 lb

## 2020-02-23 DIAGNOSIS — E559 Vitamin D deficiency, unspecified: Secondary | ICD-10-CM

## 2020-02-23 DIAGNOSIS — I10 Essential (primary) hypertension: Secondary | ICD-10-CM | POA: Diagnosis not present

## 2020-02-23 DIAGNOSIS — R7303 Prediabetes: Secondary | ICD-10-CM

## 2020-02-23 DIAGNOSIS — F4323 Adjustment disorder with mixed anxiety and depressed mood: Secondary | ICD-10-CM

## 2020-02-23 DIAGNOSIS — Z0000 Encounter for general adult medical examination without abnormal findings: Secondary | ICD-10-CM

## 2020-02-23 NOTE — Assessment & Plan Note (Signed)
-  BP at goal today, will continue to control with diet. -Continue to stay active and follow a low sodium diet. -Continue ambulatory BP monitoring. -Will continue to monitor.

## 2020-02-23 NOTE — Assessment & Plan Note (Signed)
-  Last A1c 5.8, will repeat today. -Continue to stay active and monitor carbohydrates and glucose. -Will continue to monitor. 

## 2020-02-23 NOTE — Patient Instructions (Signed)
DASH Eating Plan DASH stands for "Dietary Approaches to Stop Hypertension." The DASH eating plan is a healthy eating plan that has been shown to reduce high blood pressure (hypertension). It may also reduce your risk for type 2 diabetes, heart disease, and stroke. The DASH eating plan may also help with weight loss. What are tips for following this plan?  General guidelines  Avoid eating more than 2,300 mg (milligrams) of salt (sodium) a day. If you have hypertension, you may need to reduce your sodium intake to 1,500 mg a day.  Limit alcohol intake to no more than 1 drink a day for nonpregnant women and 2 drinks a day for men. One drink equals 12 oz of beer, 5 oz of wine, or 1 oz of hard liquor.  Work with your health care provider to maintain a healthy body weight or to lose weight. Ask what an ideal weight is for you.  Get at least 30 minutes of exercise that causes your heart to beat faster (aerobic exercise) most days of the week. Activities may include walking, swimming, or biking.  Work with your health care provider or diet and nutrition specialist (dietitian) to adjust your eating plan to your individual calorie needs. Reading food labels   Check food labels for the amount of sodium per serving. Choose foods with less than 5 percent of the Daily Value of sodium. Generally, foods with less than 300 mg of sodium per serving fit into this eating plan.  To find whole grains, look for the word "whole" as the first word in the ingredient list. Shopping  Buy products labeled as "low-sodium" or "no salt added."  Buy fresh foods. Avoid canned foods and premade or frozen meals. Cooking  Avoid adding salt when cooking. Use salt-free seasonings or herbs instead of table salt or sea salt. Check with your health care provider or pharmacist before using salt substitutes.  Do not fry foods. Cook foods using healthy methods such as baking, boiling, grilling, and broiling instead.  Cook with  heart-healthy oils, such as olive, canola, soybean, or sunflower oil. Meal planning  Eat a balanced diet that includes: ? 5 or more servings of fruits and vegetables each day. At each meal, try to fill half of your plate with fruits and vegetables. ? Up to 6-8 servings of whole grains each day. ? Less than 6 oz of lean meat, poultry, or fish each day. A 3-oz serving of meat is about the same size as a deck of cards. One egg equals 1 oz. ? 2 servings of low-fat dairy each day. ? A serving of nuts, seeds, or beans 5 times each week. ? Heart-healthy fats. Healthy fats called Omega-3 fatty acids are found in foods such as flaxseeds and coldwater fish, like sardines, salmon, and mackerel.  Limit how much you eat of the following: ? Canned or prepackaged foods. ? Food that is high in trans fat, such as fried foods. ? Food that is high in saturated fat, such as fatty meat. ? Sweets, desserts, sugary drinks, and other foods with added sugar. ? Full-fat dairy products.  Do not salt foods before eating.  Try to eat at least 2 vegetarian meals each week.  Eat more home-cooked food and less restaurant, buffet, and fast food.  When eating at a restaurant, ask that your food be prepared with less salt or no salt, if possible. What foods are recommended? The items listed may not be a complete list. Talk with your dietitian about   what dietary choices are best for you. Grains Whole-grain or whole-wheat bread. Whole-grain or whole-wheat pasta. Brown rice. Oatmeal. Quinoa. Bulgur. Whole-grain and low-sodium cereals. Pita bread. Low-fat, low-sodium crackers. Whole-wheat flour tortillas. Vegetables Fresh or frozen vegetables (raw, steamed, roasted, or grilled). Low-sodium or reduced-sodium tomato and vegetable juice. Low-sodium or reduced-sodium tomato sauce and tomato paste. Low-sodium or reduced-sodium canned vegetables. Fruits All fresh, dried, or frozen fruit. Canned fruit in natural juice (without  added sugar). Meat and other protein foods Skinless chicken or turkey. Ground chicken or turkey. Pork with fat trimmed off. Fish and seafood. Egg whites. Dried beans, peas, or lentils. Unsalted nuts, nut butters, and seeds. Unsalted canned beans. Lean cuts of beef with fat trimmed off. Low-sodium, lean deli meat. Dairy Low-fat (1%) or fat-free (skim) milk. Fat-free, low-fat, or reduced-fat cheeses. Nonfat, low-sodium ricotta or cottage cheese. Low-fat or nonfat yogurt. Low-fat, low-sodium cheese. Fats and oils Soft margarine without trans fats. Vegetable oil. Low-fat, reduced-fat, or light mayonnaise and salad dressings (reduced-sodium). Canola, safflower, olive, soybean, and sunflower oils. Avocado. Seasoning and other foods Herbs. Spices. Seasoning mixes without salt. Unsalted popcorn and pretzels. Fat-free sweets. What foods are not recommended? The items listed may not be a complete list. Talk with your dietitian about what dietary choices are best for you. Grains Baked goods made with fat, such as croissants, muffins, or some breads. Dry pasta or rice meal packs. Vegetables Creamed or fried vegetables. Vegetables in a cheese sauce. Regular canned vegetables (not low-sodium or reduced-sodium). Regular canned tomato sauce and paste (not low-sodium or reduced-sodium). Regular tomato and vegetable juice (not low-sodium or reduced-sodium). Pickles. Olives. Fruits Canned fruit in a light or heavy syrup. Fried fruit. Fruit in cream or butter sauce. Meat and other protein foods Fatty cuts of meat. Ribs. Fried meat. Bacon. Sausage. Bologna and other processed lunch meats. Salami. Fatback. Hotdogs. Bratwurst. Salted nuts and seeds. Canned beans with added salt. Canned or smoked fish. Whole eggs or egg yolks. Chicken or turkey with skin. Dairy Whole or 2% milk, cream, and half-and-half. Whole or full-fat cream cheese. Whole-fat or sweetened yogurt. Full-fat cheese. Nondairy creamers. Whipped toppings.  Processed cheese and cheese spreads. Fats and oils Butter. Stick margarine. Lard. Shortening. Ghee. Bacon fat. Tropical oils, such as coconut, palm kernel, or palm oil. Seasoning and other foods Salted popcorn and pretzels. Onion salt, garlic salt, seasoned salt, table salt, and sea salt. Worcestershire sauce. Tartar sauce. Barbecue sauce. Teriyaki sauce. Soy sauce, including reduced-sodium. Steak sauce. Canned and packaged gravies. Fish sauce. Oyster sauce. Cocktail sauce. Horseradish that you find on the shelf. Ketchup. Mustard. Meat flavorings and tenderizers. Bouillon cubes. Hot sauce and Tabasco sauce. Premade or packaged marinades. Premade or packaged taco seasonings. Relishes. Regular salad dressings. Where to find more information:  National Heart, Lung, and Blood Institute: www.nhlbi.nih.gov  American Heart Association: www.heart.org Summary  The DASH eating plan is a healthy eating plan that has been shown to reduce high blood pressure (hypertension). It may also reduce your risk for type 2 diabetes, heart disease, and stroke.  With the DASH eating plan, you should limit salt (sodium) intake to 2,300 mg a day. If you have hypertension, you may need to reduce your sodium intake to 1,500 mg a day.  When on the DASH eating plan, aim to eat more fresh fruits and vegetables, whole grains, lean proteins, low-fat dairy, and heart-healthy fats.  Work with your health care provider or diet and nutrition specialist (dietitian) to adjust your eating plan to your   individual calorie needs. This information is not intended to replace advice given to you by your health care provider. Make sure you discuss any questions you have with your health care provider. Document Revised: 03/05/2017 Document Reviewed: 03/16/2016 Elsevier Patient Education  2020 [employer] Inc.  

## 2020-02-23 NOTE — Assessment & Plan Note (Signed)
-  PHQ-9 score of 5, mildly improved from prior. -Patient intolerant to Sertraline and prefers to pursue BH therapy so will place referral. -Encourage to continue with exercise routine. -Will continue to monitor. 

## 2020-02-23 NOTE — Progress Notes (Signed)
Established Patient Office Visit  Subjective:  Patient ID: Warren Herrera, male    DOB: [birth year]  Age: 44 y.o. MRN: [id number]  CC:  Chief Complaint  Patient presents with  . Pre-Diabetes  . Hypertension    HPI [name] presents for follow up on hypertension, prediabetes and mood.  HTN: Pt denies chest pain, palpitations, dizziness, headache or lower extremity edema. Continues to check BP at home  and readings average 130s/70-80. Pt continues to stay active.  Prediabetes: Monitors carbohydrates and glucose. Denies increased thirst or urination.   Mood: States would like to see a therapist. Tried Sertraline but did not tolerate well. Reports sensitivity to medications.  Past Medical History:  Diagnosis Date  . Allergy     Past Surgical History:  Procedure Laterality Date  . NASAL SINUS SURGERY      Family History  Problem Relation Age of Onset  . Hypertension Mother   . Thyroid disease Mother     Social History   Socioeconomic History  . Marital status: Married    Spouse name: Not on file  . Number of children: Not on file  . Years of education: Not on file  . Highest education level: Not on file  Occupational History  . Occupation: Company secretary  Tobacco Use  . Smoking status: Former Smoker    Packs/day: 1.00    Years: 4.00    Pack years: 4.00    Types: Cigarettes    Quit date: 2004    Years since quitting: 17.8  . Smokeless tobacco: Never Used  Vaping Use  . Vaping Use: Never used  Substance and Sexual Activity  . Alcohol use: No  . Drug use: No  . Sexual activity: Yes    Birth control/protection: None  Other Topics Concern  . Not on file  Social History Narrative  . Not on file   Social Determinants of Health   Financial Resource Strain:   . Difficulty of Paying Living Expenses: Not on file  Food Insecurity:   . Worried About Charity fundraiser in the Last Year: Not on file  . Ran Out of Food in the Last Year: Not on file  Transportation  Needs:   . Lack of Transportation (Medical): Not on file  . Lack of Transportation (Non-Medical): Not on file  Physical Activity:   . Days of Exercise per Week: Not on file  . Minutes of Exercise per Session: Not on file  Stress:   . Feeling of Stress : Not on file  Social Connections:   . Frequency of Communication with Friends and Family: Not on file  . Frequency of Social Gatherings with Friends and Family: Not on file  . Attends Religious Services: Not on file  . Active Member of Clubs or Organizations: Not on file  . Attends Archivist Meetings: Not on file  . Marital Status: Not on file  Intimate Partner Violence:   . Fear of Current or Ex-Partner: Not on file  . Emotionally Abused: Not on file  . Physically Abused: Not on file  . Sexually Abused: Not on file    Outpatient Medications Prior to Visit  Medication Sig Dispense Refill  . Cholecalciferol (VITAMIN D3) 125 MCG (5000 UT) TABS 5,000 IU OTC vitamin D3 daily. 90 tablet 3  . diazepam (VALIUM) 10 MG tablet 1 tab po 30 min prior to procedure 1 tablet 0  . fexofenadine (ALLEGRA) 180 MG tablet Take 1 tablet (180 mg total) by  mouth daily. 90 tablet 3  . fluticasone (FLONASE) 50 MCG/ACT nasal spray 1 spray 2 times daily after neil med or AYR sinus rinses 16 g 4  . HYDROcodone-acetaminophen (NORCO/VICODIN) 5-325 MG tablet Take 1 tablet by mouth every 6 (six) hours as needed for moderate pain. 8 tablet 0  . loratadine-pseudoephedrine (CLARITIN-D 12-HOUR) 5-120 MG tablet Take 1 tablet by mouth 2 (two) times daily.    . multivitamin (ONE-A-DAY MEN'S) TABS tablet Take 1 tablet by mouth daily. 90 tablet 0  . predniSONE (DELTASONE) 20 MG tablet Take 1 tablet by mouth daily.     . pregabalin (LYRICA) 75 MG capsule Take 1 capsule (75 mg total) by mouth 2 (two) times daily. 60 capsule 1  . sertraline (ZOLOFT) 25 MG tablet Take 1 tablet (25 mg total) by mouth daily. 60 tablet 1   No facility-administered medications prior to  visit.    No Known Allergies  ROS Review of Systems A fourteen system review of systems was performed and found to be positive as per HPI.   Objective:    Physical Exam General:  Well Developed, well nourished, appropriate for stated age.  Neuro:  Alert and oriented,  extra-ocular muscles intact  HEENT:  Normocephalic, atraumatic, neck supple Skin:  no gross rash, warm, pink. Cardiac:  RRR, S1 S2, no murmur Respiratory:  ECTA B/L, Not using accessory muscles, speaking in full sentences- unlabored. Vascular:  Ext warm, no cyanosis apprec.; cap RF less 2 sec. Psych:  No HI/SI, judgement and insight good, mood- normal  BP 128/82   Pulse 77   Temp 98.4 F (36.9 C) (Oral)   Ht $R'6\' 3"'LV$  (1.905 m)   Wt 260 lb 4.8 oz (118.1 kg)   SpO2 100% Comment: on RA  BMI 32.54 kg/m  Wt Readings from Last 3 Encounters:  02/23/20 260 lb 4.8 oz (118.1 kg)  01/19/20 247 lb (112 kg)  11/24/19 254 lb (115.2 kg)     Health Maintenance Due  Topic Date Due  . Hepatitis C Screening  Never done  . COVID-19 Vaccine (1) Never done  . HIV Screening  Never done  . TETANUS/TDAP  Never done  . INFLUENZA VACCINE  11/05/2019    There are no preventive care reminders to display for this patient.  Lab Results  Component Value Date   TSH 1.050 05/16/2018   Lab Results  Component Value Date   WBC 4.2 05/16/2018   HGB 13.6 05/16/2018   HCT 40.7 05/16/2018   MCV 83 05/16/2018   PLT 223 05/16/2018   Lab Results  Component Value Date   NA 141 05/16/2018   K 4.5 05/16/2018   CO2 24 05/16/2018   GLUCOSE 94 05/16/2018   BUN 12 05/16/2018   CREATININE 1.12 05/16/2018   BILITOT 0.2 05/16/2018   ALKPHOS 61 05/16/2018   AST 23 05/16/2018   ALT 36 05/16/2018   PROT 6.5 05/16/2018   ALBUMIN 4.1 05/16/2018   CALCIUM 9.1 05/16/2018   Lab Results  Component Value Date   CHOL 140 05/16/2018   Lab Results  Component Value Date   HDL 50 05/16/2018   Lab Results  Component Value Date   LDLCALC 81  05/16/2018   Lab Results  Component Value Date   TRIG 44 05/16/2018   Lab Results  Component Value Date   CHOLHDL 2.8 05/16/2018   Lab Results  Component Value Date   HGBA1C 5.8 (H) 05/16/2018      Assessment & Plan:  Problem List Items Addressed This Visit      Cardiovascular and Mediastinum   Primary hypertension - Primary    -BP at goal today, will continue to control with diet. -Continue to stay active and follow a low sodium diet. -Continue ambulatory BP monitoring. -Will continue to monitor.      Relevant Orders   Comp Met (CMET)   HgB A1c   Lipid Profile   CBC w/Diff     Other   Vitamin D insufficiency    -Last Vitamin D 26.7 -Repeating Vitamin D today. -Continue Vitamin D3 supplement.      Relevant Orders   Vitamin D (25 hydroxy)   Prediabetes    -Last A1c 5.8, will repeat today. -Continue to stay active and monitor carbohydrates and glucose. -Will continue to monitor.      Relevant Orders   HgB A1c   Lipid Profile   Adjustment disorder with mixed anxiety and depressed mood    -PHQ-9 score of 5, mildly improved from prior. -Patient intolerant to Sertraline and prefers to pursue [facility] therapy so will place referral. -Encourage to continue with exercise routine. -Will continue to monitor.       Relevant Orders   Ambulatory referral to Psychology    Other Visit Diagnoses    Healthcare maintenance       Relevant Orders   Comp Met (CMET)   HgB A1c   Lipid Profile   TSH   CBC w/Diff      No orders of the defined types were placed in this encounter.   Follow-up: Return for HTN in 4-5 months .   Note:  This note was prepared with assistance of Dragon voice recognition software. Occasional wrong-word or sound-a-like substitutions may have occurred due to the inherent limitations of voice recognition software.   Lorrene Reid, PA-C

## 2020-02-23 NOTE — Assessment & Plan Note (Signed)
-  Last Vitamin D 26.7 -Repeating Vitamin D today. -Continue Vitamin D3 supplement. 

## 2020-02-24 LAB — CBC WITH DIFFERENTIAL/PLATELET
Basophils Absolute: 0 10*3/uL (ref 0.0–0.2)
Basos: 0 %
EOS (ABSOLUTE): 0.1 10*3/uL (ref 0.0–0.4)
Eos: 4 %
Hematocrit: 41.7 % (ref 37.5–51.0)
Hemoglobin: 13.8 g/dL (ref 13.0–17.7)
Immature Grans (Abs): 0 10*3/uL (ref 0.0–0.1)
Immature Granulocytes: 0 %
Lymphocytes Absolute: 1.4 10*3/uL (ref 0.7–3.1)
Lymphs: 44 %
MCH: 28.4 pg (ref 26.6–33.0)
MCHC: 33.1 g/dL (ref 31.5–35.7)
MCV: 86 fL (ref 79–97)
Monocytes Absolute: 0.3 10*3/uL (ref 0.1–0.9)
Monocytes: 9 %
Neutrophils Absolute: 1.3 10*3/uL — ABNORMAL LOW (ref 1.4–7.0)
Neutrophils: 43 %
Platelets: 192 10*3/uL (ref 150–450)
RBC: 4.86 x10E6/uL (ref 4.14–5.80)
RDW: 12.3 % (ref 11.6–15.4)
WBC: 3.1 10*3/uL — ABNORMAL LOW (ref 3.4–10.8)

## 2020-02-24 LAB — COMPREHENSIVE METABOLIC PANEL
ALT: 28 IU/L (ref 0–44)
AST: 40 IU/L (ref 0–40)
Albumin/Globulin Ratio: 1.8 (ref 1.2–2.2)
Albumin: 4.2 g/dL (ref 4.0–5.0)
Alkaline Phosphatase: 65 IU/L (ref 44–121)
BUN/Creatinine Ratio: 12 (ref 9–20)
BUN: 13 mg/dL (ref 6–24)
Bilirubin Total: 0.3 mg/dL (ref 0.0–1.2)
CO2: 24 mmol/L (ref 20–29)
Calcium: 9 mg/dL (ref 8.7–10.2)
Chloride: 106 mmol/L (ref 96–106)
Creatinine, Ser: 1.08 mg/dL (ref 0.76–1.27)
GFR calc Af Amer: 97 mL/min/{1.73_m2} (ref >59)
GFR calc non Af Amer: 84 mL/min/{1.73_m2} (ref >59)
Globulin, Total: 2.3 g/dL (ref 1.5–4.5)
Glucose: 93 mg/dL (ref 65–99)
Potassium: 4.4 mmol/L (ref 3.5–5.2)
Sodium: 141 mmol/L (ref 134–144)
Total Protein: 6.5 g/dL (ref 6.0–8.5)

## 2020-02-24 LAB — LIPID PANEL
Chol/HDL Ratio: 3 ratio (ref 0.0–5.0)
Cholesterol, Total: 134 mg/dL (ref 100–199)
HDL: 45 mg/dL (ref >39)
LDL Chol Calc (NIH): 80 mg/dL (ref 0–99)
Triglycerides: 33 mg/dL (ref 0–149)
VLDL Cholesterol Cal: 9 mg/dL (ref 5–40)

## 2020-02-24 LAB — VITAMIN D 25 HYDROXY (VIT D DEFICIENCY, FRACTURES): Vit D, 25-Hydroxy: 32 ng/mL (ref 30.0–100.0)

## 2020-02-24 LAB — TSH: TSH: 0.403 u[IU]/mL — ABNORMAL LOW (ref 0.450–4.500)

## 2020-02-24 LAB — HEMOGLOBIN A1C
Est. average glucose Bld gHb Est-mCnc: 120 mg/dL
Hgb A1c MFr Bld: 5.8 % — ABNORMAL HIGH (ref 4.8–5.6)

## 2020-02-26 ENCOUNTER — Telehealth: Payer: Self-pay | Admitting: Physician Assistant

## 2020-02-26 NOTE — Telephone Encounter (Signed)
Labs have been added. AS, CMA 

## 2020-02-28 LAB — T3: T3, Total: 114 ng/dL (ref 71–180)

## 2020-02-28 LAB — T4, FREE: Free T4: 1.37 ng/dL (ref 0.82–1.77)

## 2020-02-28 LAB — SPECIMEN STATUS REPORT

## 2020-03-07 NOTE — Telephone Encounter (Signed)
-----   Message from Mayer Masker, [place] sent at 02/28/2020  1:04 PM EST ----- Please call Warren Herrera and notify most labs are essentially within normal limits. A1c remains stable at 5.8, cholesterol panel is normal and improved from last year, TSH was borderline low but additional thyroid tests of free T4 and T3 are normal so plan to repeat thyroid labs at follow up appointment. Vitamin D is low normal so recommend to continue with Vitamin D supplement.   Thank you, Kandis Cocking

## 2020-03-27 ENCOUNTER — Emergency Department: Payer: 59

## 2020-03-27 ENCOUNTER — Emergency Department
Admission: EM | Admit: 2020-03-27 | Discharge: 2020-03-27 | Disposition: A | Discharge status: Home / Self Care | Payer: 59 | Attending: Emergency Medicine | Admitting: Emergency Medicine

## 2020-03-27 ENCOUNTER — Telehealth: Payer: Self-pay | Admitting: Physician Assistant

## 2020-03-27 ENCOUNTER — Other Ambulatory Visit: Payer: Self-pay

## 2020-03-27 ENCOUNTER — Encounter: Payer: Self-pay | Admitting: Emergency Medicine

## 2020-03-27 DIAGNOSIS — J0101 Acute recurrent maxillary sinusitis: Secondary | ICD-10-CM | POA: Diagnosis not present

## 2020-03-27 DIAGNOSIS — G510 Bell's palsy: Secondary | ICD-10-CM | POA: Insufficient documentation

## 2020-03-27 DIAGNOSIS — I10 Essential (primary) hypertension: Secondary | ICD-10-CM | POA: Insufficient documentation

## 2020-03-27 DIAGNOSIS — Z87891 Personal history of nicotine dependence: Secondary | ICD-10-CM | POA: Insufficient documentation

## 2020-03-27 DIAGNOSIS — R200 Anesthesia of skin: Secondary | ICD-10-CM | POA: Diagnosis not present

## 2020-03-27 LAB — CBC
HCT: 46.1 % (ref 39.0–52.0)
Hemoglobin: 14.7 g/dL (ref 13.0–17.0)
MCH: 27.5 pg (ref 26.0–34.0)
MCHC: 31.9 g/dL (ref 30.0–36.0)
MCV: 86.3 fL (ref 80.0–100.0)
Platelets: 210 10*3/uL (ref 150–400)
RBC: 5.34 MIL/uL (ref 4.22–5.81)
RDW: 12.8 % (ref 11.5–15.5)
WBC: 8.5 10*3/uL (ref 4.0–10.5)
nRBC: 0 % (ref 0.0–0.2)

## 2020-03-27 LAB — BASIC METABOLIC PANEL
Anion gap: 7 (ref 5–15)
BUN: 13 mg/dL (ref 6–20)
CO2: 28 mmol/L (ref 22–32)
Calcium: 9.8 mg/dL (ref 8.9–10.3)
Chloride: 104 mmol/L (ref 98–111)
Creatinine, Ser: 1.15 mg/dL (ref 0.61–1.24)
GFR, Estimated: >60 mL/min (ref >60)
Glucose, Bld: 123 mg/dL — ABNORMAL HIGH (ref 70–99)
Potassium: 4.2 mmol/L (ref 3.5–5.1)
Sodium: 139 mmol/L (ref 135–145)

## 2020-03-27 MED ORDER — VALACYCLOVIR HCL 1 G PO TABS: 1000.0000 mg | ORAL_TABLET | Freq: Three times a day (TID) | ORAL | 0 refills | Status: AC

## 2020-03-27 MED ORDER — PREDNISONE 20 MG PO TABS: 60.0000 mg | ORAL_TABLET | Freq: Every day | ORAL | 0 refills | Status: AC

## 2020-03-27 MED ORDER — VALACYCLOVIR HCL 1 G PO TABS: 2000.0000 mg | ORAL_TABLET | Freq: Three times a day (TID) | ORAL | 0 refills | Status: DC

## 2020-03-27 NOTE — Discharge Instructions (Addendum)
For your Bell's Palsy:  Continue the amoxicillin-clavulanic acid prescribed to you  Instead of prednisone 40 mg x 5 days, take the 60 mg x 7 days prescribed today  Start the Valtrex 1000 mg three times a day for 7 days  Go to [facility], [facility], or [employer] and purchase a protective eye patch. Wear this at night. I recommend using an over-the-counter lubricating eye drop at night as well to help prevent eye injury.  Follow-up with your PCP in 1-2 weeks if symptoms do not improve

## 2020-03-27 NOTE — ED Provider Notes (Signed)
[facility] Emergency Department Provider Note  ____________________________________________   Event Date/Time   First MD Initiated Contact with Patient 03/27/20 1532     (approximate)  I have reviewed the triage vital signs and the nursing notes.   HISTORY  Chief Complaint Headache and Dizziness    HPI SHARAD VANEATON is a 44 y.o. male   here with left facial numbness, weakness. Pt reports a long h/o chronic sinusitis. He states that over the past 2 days, he's had worsening aching, throbbing, left ear pain and maxillary sinus pain. Over the past 24 hours or so, he's noticed increasing weakness to the L side of his face. He has had difficulty closing his eyes and mouth. He saw his PCP and was started on prednisone and augmentin yesterday. No known fevers, chills. No vision changes. No focal numbness or weakness. No other complaints. No specific alleviating factors. He has a h/o shingles as well on the L side, has never had facial zoster.       Past Medical History:  Diagnosis Date  . Allergy     Patient Active Problem List   Diagnosis Date Noted  . Primary hypertension 02/23/2020  . Adjustment disorder with mixed anxiety and depressed mood 11/17/2019  . History of sinus surgery 05/19/2018  . Chronic sinusitis 05/19/2018  . Environmental and seasonal allergies 05/19/2018  . Vitamin D insufficiency 05/19/2018  . Neuropathy involving both lower extremities- gastroc. and distally 05/19/2018  . Prediabetes 05/19/2018  . Poorly controlled blood pressure-  declines meds, prefers 4 additional months of lifestyle change 05/19/2018  . Elevated serum creatinine 05/19/2018  . Postherpetic neuralgia at T3-T5 level- R post chest wall 03/11/2018  . BMI 35.0-35.9,adult 03/11/2018  . Elevated blood-pressure reading without diagnosis of hypertension 08/14/2015    Past Surgical History:  Procedure Laterality Date  . NASAL SINUS SURGERY      Prior to Admission  medications   Medication Sig Start Date End Date Taking? Authorizing Provider  Cholecalciferol (VITAMIN D3) 125 MCG (5000 UT) TABS 5,000 IU OTC vitamin D3 daily. 05/19/18   Opalski, Gavin Pound, DO  diazepam (VALIUM) 10 MG tablet 1 tab po 30 min prior to procedure 01/16/20   Stoioff, Verna Czech, MD  fexofenadine (ALLEGRA) 180 MG tablet Take 1 tablet (180 mg total) by mouth daily. 05/19/18   Opalski, Gavin Pound, DO  fluticasone (FLONASE) 50 MCG/ACT nasal spray 1 spray 2 times daily after neil med or AYR sinus rinses 05/19/18   Opalski, Deborah, DO  HYDROcodone-acetaminophen (NORCO/VICODIN) 5-325 MG tablet Take 1 tablet by mouth every 6 (six) hours as needed for moderate pain. 01/19/20   Stoioff, Verna Czech, MD  loratadine-pseudoephedrine (CLARITIN-D 12-HOUR) 5-120 MG tablet Take 1 tablet by mouth 2 (two) times daily.    [provider]  multivitamin (ONE-A-DAY MEN'S) TABS tablet Take 1 tablet by mouth daily. 05/24/18   Opalski, Gavin Pound, DO  predniSONE (DELTASONE) 20 MG tablet Take 3 tablets (60 mg total) by mouth daily for 7 days. 03/27/20 04/03/20  Shaune Pollack, MD  pregabalin (LYRICA) 75 MG capsule Take 1 capsule (75 mg total) by mouth 2 (two) times daily. 11/17/19   Mayer Masker, PA-C  valACYclovir (VALTREX) 1000 MG tablet Take 1 tablet (1,000 mg total) by mouth 3 (three) times daily for 7 days. 03/27/20 04/03/20  Shaune Pollack, MD    Allergies Patient has no known allergies.  Family History  Problem Relation Age of Onset  . Hypertension Mother   . Thyroid disease Mother  Social History Social History   Tobacco Use  . Smoking status: Former Smoker    Packs/day: 1.00    Years: 4.00    Pack years: 4.00    Types: Cigarettes    Quit date: 2004    Years since quitting: 17.9  . Smokeless tobacco: Never Used  Vaping Use  . Vaping Use: Never used  Substance Use Topics  . Alcohol use: No  . Drug use: No    Review of Systems  Review of Systems  Constitutional: Negative for  chills and fever.  HENT: Negative for sore throat.   Respiratory: Negative for shortness of breath.   Cardiovascular: Negative for chest pain.  Gastrointestinal: Negative for abdominal pain.  Genitourinary: Negative for flank pain.  Musculoskeletal: Negative for neck pain.  Skin: Negative for rash and wound.  Allergic/Immunologic: Negative for immunocompromised state.  Neurological: Positive for facial asymmetry and headaches. Negative for weakness and numbness.  Hematological: Does not bruise/bleed easily.  All other systems reviewed and are negative.    ____________________________________________  PHYSICAL EXAM:      VITAL SIGNS: ED Triage Vitals  Enc Vitals Group     BP 03/27/20 0833 (!) 156/93     Pulse Rate 03/27/20 0833 (!) 101     Resp 03/27/20 0833 17     Temp 03/27/20 0833 99.1 F (37.3 C)     Temp Source 03/27/20 0833 Oral     SpO2 03/27/20 0833 99 %     Weight 03/27/20 0833 257 lb 4.4 oz (116.7 kg)     Height 03/27/20 0833 6\' 3"  (1.905 m)     Head Circumference --      Peak Flow --      Pain Score 03/27/20 0847 4     Pain Loc --      Pain Edu? --      Excl. in GC? --      Physical Exam Vitals and nursing note reviewed.  Constitutional:      General: He is not in acute distress.    Appearance: He is well-developed and well-nourished.  HENT:     Head: Normocephalic and atraumatic.     Comments: Mild serous effusion left TM. No mastoid tenderness. No facial redness or swelling. Eyes:     Conjunctiva/sclera: Conjunctivae normal.  Cardiovascular:     Rate and Rhythm: Normal rate and regular rhythm.     Heart sounds: Normal heart sounds. No murmur heard. No friction rub.  Pulmonary:     Effort: Pulmonary effort is normal. No respiratory distress.     Breath sounds: Normal breath sounds. No wheezing or rales.  Abdominal:     General: There is no distension.     Palpations: Abdomen is soft.     Tenderness: There is no abdominal tenderness.   Musculoskeletal:        General: No edema.     Cervical back: Neck supple.  Skin:    General: Skin is warm.     Capillary Refill: Capillary refill takes less than 2 seconds.     Findings: No rash.  Neurological:     Mental Status: He is alert and oriented to person, place, and time.     Motor: No abnormal muscle tone.     Comments: Paralysis of left facial nerve with involvement of forehead. Tongue protrusion is midline. EOMI, Strength 5/5 bl UE and LE. Normal sensation to light touch.       ____________________________________________   LABS (all labs ordered  are listed, but only abnormal results are displayed)  Labs Reviewed  BASIC METABOLIC PANEL - Abnormal; Notable for the following components:      Result Value   Glucose, Bld 123 (*)    All other components within normal limits  CBC  URINALYSIS, COMPLETE (UACMP) WITH MICROSCOPIC    ____________________________________________  EKG:  ________________________________________  RADIOLOGY All imaging, including plain films, CT scans, and ultrasounds, independently reviewed by me, and interpretations confirmed via formal radiology reads.  ED MD interpretation:   CT Head: NAICA  Official radiology report(s): CT Head Wo Contrast  Result Date: 03/27/2020 CLINICAL DATA:  Headache.  Blurred vision.  Dizziness. EXAM: CT HEAD WITHOUT CONTRAST TECHNIQUE: Contiguous axial images were obtained from the base of the skull through the vertex without intravenous contrast. COMPARISON:  None. FINDINGS: Brain: No evidence of acute large vascular territory infarction, hemorrhage, hydrocephalus, extra-axial collection or mass lesion/mass effect. Vascular: When accounting for streak artifact, no convincing hyperdense vessel. Skull: No acute fracture. Sinuses/Orbits: Mucosal thickening of the right frontal sinus, scattered ethmoid air cells, and left sphenoid sinus. Other: No mastoid effusions. IMPRESSION: 1. No evidence of acute intracranial  abnormality. 2. Nonspecific frontoethmoidal and left sphenoid sinus mucosal thickening. Correlate with signs/symptoms of sinusitis. Electronically Signed   By: Feliberto Harts MD   On: 03/27/2020 11:07    ____________________________________________  PROCEDURES   Procedure(s) performed (including Critical Care):  Procedures  ____________________________________________  INITIAL IMPRESSION / MDM / ASSESSMENT AND PLAN / ED COURSE  As part of my medical decision making, I reviewed the following data within the electronic MEDICAL RECORD NUMBER Nursing notes reviewed and incorporated, Old chart reviewed, Notes from prior ED visits, and [place] Controlled Substance Database       *Loren Sawaya Morain was evaluated in Emergency Department on 03/27/2020 for the symptoms described in the history of present illness. He was evaluated in the context of the global COVID-19 pandemic, which necessitated consideration that the patient might be at risk for infection with the SARS-CoV-2 virus that causes COVID-19. Institutional protocols and algorithms that pertain to the evaluation of patients at risk for COVID-19 are in a state of rapid change based on information released by regulatory bodies including the CDC and federal and state organizations. These policies and algorithms were followed during the patient's care in the ED.  Some ED evaluations and interventions may be delayed as a result of limited staffing during the pandemic.*     Medical Decision Making:  44 yo M here with left-sided headache, ear pain, and Bell's Palsy. Suspect sinusitis, vs primary zoster-related Bell's palsy. No signs of ocular involvement. No rash. Pt has no fever, meningismus or signs of meningitis, encephalitis, or intra-cranial complications. CT head shows sinusitis w/o bony breakdown or issues. No other high risk features. Labs unremarkable. Will tx with prednisone, augmentin, valtrex, outpt  follow-up.  ____________________________________________  FINAL CLINICAL IMPRESSION(S) / ED DIAGNOSES  Final diagnoses:  Bell's palsy  Acute recurrent maxillary sinusitis     MEDICATIONS GIVEN DURING THIS VISIT:  Medications - No data to display   ED Discharge Orders         Ordered    predniSONE (DELTASONE) 20 MG tablet  Daily        03/27/20 1622    valACYclovir (VALTREX) 1000 MG tablet  3 times daily,   Status:  Discontinued        03/27/20 1622    valACYclovir (VALTREX) 1000 MG tablet  3 times daily  03/27/20 1622           Note:  This document was prepared using Dragon voice recognition software and may include unintentional dictation errors.   Shaune Pollack, MD 03/27/20 [phone]

## 2020-03-27 NOTE — Telephone Encounter (Signed)
Patient called our office stating he has been having a left sided headache and left sided neck pain with delayed blinking of the left eye and mouth numbness on the left side.   Patient is currently admitted at [facility] and was requesting if we suggest he leave to ED and go to [facility].   I advised patient he was in the best place should he be having a stroke and that we suggest he stay for evaluation and treatment.   The power went out in our office and the call was dropped. I attempted to call patient back but he did not answer. I did leave this suggestions again on his voicemail. AS, CMA

## 2020-03-27 NOTE — ED Triage Notes (Signed)
Pt reports 2 days ago he started with a headache to the left side of his head and some blurred vision and dizziness. Pt reports has a hx of sinus infections and will often get headaches just on one side but pain has not went away. Pt concerned it may be more this time. No drooping noted, pt ambulatory with steady gait.

## 2020-04-09 ENCOUNTER — Ambulatory Visit: Payer: 59 | Admitting: Physician Assistant

## 2020-04-09 ENCOUNTER — Other Ambulatory Visit: Payer: Self-pay

## 2020-04-09 ENCOUNTER — Encounter: Payer: Self-pay | Admitting: Physician Assistant

## 2020-04-09 ENCOUNTER — Ambulatory Visit (INDEPENDENT_AMBULATORY_CARE_PROVIDER_SITE_OTHER): Payer: BC Managed Care – PPO | Admitting: Physician Assistant

## 2020-04-09 VITALS — BP 139/89 | HR 93 | Ht 75.0 in | Wt 253.4 lb

## 2020-04-09 DIAGNOSIS — R519 Headache, unspecified: Secondary | ICD-10-CM | POA: Diagnosis not present

## 2020-04-09 DIAGNOSIS — J329 Chronic sinusitis, unspecified: Secondary | ICD-10-CM | POA: Diagnosis not present

## 2020-04-09 DIAGNOSIS — B0229 Other postherpetic nervous system involvement: Secondary | ICD-10-CM | POA: Diagnosis not present

## 2020-04-09 DIAGNOSIS — G510 Bell's palsy: Secondary | ICD-10-CM | POA: Diagnosis not present

## 2020-04-09 MED ORDER — PREGABALIN 75 MG PO CAPS: 75.0000 mg | ORAL_CAPSULE | Freq: Two times a day (BID) | ORAL | 1 refills | Status: DC

## 2020-04-09 MED ORDER — PREDNISONE 10 MG PO TABS: 10.0000 mg | ORAL_TABLET | Freq: Every day | ORAL | 0 refills | Status: DC

## 2020-04-09 MED ORDER — CYCLOBENZAPRINE HCL 10 MG PO TABS: 10.0000 mg | ORAL_TABLET | Freq: Every day | ORAL | 0 refills | Status: DC

## 2020-04-09 NOTE — Patient Instructions (Signed)
Bell Palsy, Adult  Bell palsy is a short-term inability to move muscles in part of the face. The inability to move (paralysis) results from inflammation or compression of the facial nerve, which travels along the skull and under the ear to the side of the face (7th cranial nerve). This nerve is responsible for facial movements that include blinking, closing the eyes, smiling, and frowning. What are the causes? The exact cause of this condition is not known. It may be caused by an infection from a virus, such as the chickenpox (herpes zoster), Epstein-Barr, or mumps virus. What increases the risk? You are more likely to develop this condition if:  You are pregnant.  You have diabetes.  You have had a recent infection in your nose, throat, or airways (upper respiratory infection).  You have a weakened body defense system (immune system).  You have had a facial injury, such as a fracture.  You have a family history of Bell palsy. What are the signs or symptoms? Symptoms of this condition include:  Weakness on one side of the face.  Drooping eyelid and corner of the mouth.  Excessive tearing in one eye.  Difficulty closing the eyelid.  Dry eye.  Drooling.  Dry mouth.  Changes in taste.  Change in facial appearance.  Pain behind one ear.  Ringing in one or both ears.  Sensitivity to sound in one ear.  Facial twitching.  Headache.  Impaired speech.  Dizziness.  Difficulty eating or drinking. Most of the time, only one side of the face is affected. Rarely, Bell palsy affects the whole face. How is this diagnosed? This condition is diagnosed based on:  Your symptoms.  Your medical history.  A physical exam. You may also have to see health care providers who specialize in disorders of the nerves (neurologist) or diseases and conditions of the eye (ophthalmologist). You may have tests, such as:  A test to check for nerve damage (electromyogram).  Imaging  studies, such as CT or MRI scans.  Blood tests. How is this treated? This condition affects every person differently. Sometimes symptoms go away without treatment within a couple weeks. If treatment is needed, it varies from person to person. The goal of treatment is to reduce inflammation and protect the eye from damage. Treatment for Bell palsy may include:  Medicines, such as: ? Steroids to reduce swelling and inflammation. ? Antiviral drugs. ? Pain relievers, including aspirin, acetaminophen, or ibuprofen.  Eye drops or ointment to keep your eye moist.  Eye protection, if you cannot close your eye.  Exercises or massage to regain muscle strength and function (physical therapy). Follow these instructions at home:   Take over-the-counter and prescription medicines only as told by your health care provider.  If your eye is affected: ? Keep your eye moist with eye drops or ointment as told by your health care provider. ? Follow instructions for eye care and protection as told by your health care provider.  Do any physical therapy exercises as told by your health care provider.  Keep all follow-up visits as told by your health care provider. This is important. Contact a health care provider if:  You have a fever.  Your symptoms do not get better within 2-3 weeks, or your symptoms get worse.  Your eye is red, irritated, or painful.  You have new symptoms. Get help right away if:  You have weakness or numbness in a part of your body other than your face.  You have   trouble swallowing.  You develop neck pain or stiffness.  You develop dizziness or shortness of breath. Summary  Bell palsy is a short-term inability to move muscles in part of the face. The inability to move (paralysis) results from inflammation or compression of the facial nerve.  This condition affects every person differently. Sometimes symptoms go away without treatment within a couple weeks.  If  treatment is needed, it varies from person to person. The goal of treatment is to reduce inflammation and protect the eye from damage.  Contact your health care provider if your symptoms do not get better within 2-3 weeks, or your symptoms get worse. This information is not intended to replace advice given to you by your health care provider. Make sure you discuss any questions you have with your health care provider. Document Revised: 03/05/2017 Document Reviewed: 05/26/2016 Elsevier Patient Education  2020 [employer] Inc[employer]  

## 2020-04-09 NOTE — Progress Notes (Signed)
Established Patient Office Visit  Subjective:  Patient ID: Warren Herrera, male    DOB: 1976/03/19  Age: 45 y.o. MRN: [id number]  CC:  Chief Complaint  Patient presents with  . Hospitalization Follow-up    HPI MATYAS BAISLEY presents for ED follow up. Patient was treated for Bell's Palsy and recurrent sinusitis with prednisone, Augmentin, and Valtrex 03/27/20. Patient reports a history of chronic sinusitis which trigger headaches and had a previous sinus surgery, and is due for another one. Patient also has a history of shingles without rash component.  Patient complains of shoulder pain which radiates to his neck.  Continues to use eyedrops.  States can close his left eye to some degree.  Reports sensitivity to light.  Felt like prednisone was helping until he completed the taper.  Patient has been under significant personal stress related to his divorce and work.  Past Medical History:  Diagnosis Date  . Bell's palsy   . Chronic sinusitis   . Left-sided Bell's palsy 04/12/2020  . Seasonal allergies   . Shingles     Past Surgical History:  Procedure Laterality Date  . NASAL SINUS SURGERY      Family History  Problem Relation Age of Onset  . Hypertension Mother   . Thyroid disease Mother   . Glaucoma Father   . Cataracts Father     Social History   Socioeconomic History  . Marital status: Married    Spouse name: Not on file  . Number of children: 4  . Years of education: college  . Highest education level: Bachelor's degree (e.g., BA, AB, BS)  Occupational History  . Occupation: Company secretary  Tobacco Use  . Smoking status: Former Smoker    Packs/day: 1.00    Years: 4.00    Pack years: 4.00    Types: Cigarettes    Quit date: 2004    Years since quitting: 18.0  . Smokeless tobacco: Never Used  Vaping Use  . Vaping Use: Never used  Substance and Sexual Activity  . Alcohol use: No  . Drug use: No  . Sexual activity: Yes    Birth control/protection: None  Other  Topics Concern  . Not on file  Social History Narrative   Lives with his family.   Right-handed.   Caffeine use: 4 cups per day.   Social Determinants of Health   Financial Resource Strain: Not on file  Food Insecurity: Not on file  Transportation Needs: Not on file  Physical Activity: Not on file  Stress: Not on file  Social Connections: Not on file  Intimate Partner Violence: Not on file    Outpatient Medications Prior to Visit  Medication Sig Dispense Refill  . Cholecalciferol (VITAMIN D3) 125 MCG (5000 UT) TABS 5,000 IU OTC vitamin D3 daily. 90 tablet 3  . fexofenadine (ALLEGRA) 180 MG tablet Take 1 tablet (180 mg total) by mouth daily. 90 tablet 3  . fluticasone (FLONASE) 50 MCG/ACT nasal spray 1 spray 2 times daily after neil med or AYR sinus rinses 16 g 4  . loratadine-pseudoephedrine (CLARITIN-D 12-HOUR) 5-120 MG tablet Take 1 tablet by mouth 2 (two) times daily.    . multivitamin (ONE-A-DAY MEN'S) TABS tablet Take 1 tablet by mouth daily. 90 tablet 0  . diazepam (VALIUM) 10 MG tablet 1 tab po 30 min prior to procedure (Patient not taking: Reported on 04/09/2020) 1 tablet 0  . HYDROcodone-acetaminophen (NORCO/VICODIN) 5-325 MG tablet Take 1 tablet by mouth every 6 (six) hours  as needed for moderate pain. (Patient not taking: Reported on 04/09/2020) 8 tablet 0  . pregabalin (LYRICA) 75 MG capsule Take 1 capsule (75 mg total) by mouth 2 (two) times daily. (Patient not taking: Reported on 04/09/2020) 60 capsule 1   No facility-administered medications prior to visit.    No Known Allergies  ROS Review of Systems A fourteen system review of systems was performed and found to be positive as per HPI.   Objective:    Physical Exam General:  Well Developed, well nourished, appropriate for stated age.  Neuro:  Alert and oriented, EOM intact, tongue midline, paralysis of facial nerve on left side (forehead involved), sensation intact to light touch, good strength of lower and upper  extremity bilaterally HEENT:  Normocephalic, atraumatic, normal TM's of both ears, negative Tragus and Pinna signs bilaterally, neck supple Skin:  no gross rash, warm, pink. Cardiac:  RRR, S1 S2, w/o murmur Respiratory:  ECTA B/L, Not using accessory muscles, speaking in full sentences- unlabored. MSK: Good ROM, spasticity/tension of trapezius, no step off or bony abnormality appreciated Vascular:  Ext warm, no cyanosis apprec.; cap RF less 2 sec. Psych:  No HI/SI, judgement and insight good, Euthymic mood. Full Affect.  BP 139/89   Pulse 93   Ht 6\' 3"  (1.905 m)   Wt 253 lb 6.4 oz (114.9 kg)   SpO2 99%   BMI 31.67 kg/m  Wt Readings from Last 3 Encounters:  04/12/20 256 lb 3.2 oz (116.2 kg)  04/09/20 253 lb 6.4 oz (114.9 kg)  03/27/20 260 lb (117.9 kg)     Health Maintenance Due  Topic Date Due  . Hepatitis C Screening  Never done  . COVID-19 Vaccine (1) Never done  . HIV Screening  Never done  . TETANUS/TDAP  Never done  . INFLUENZA VACCINE  11/05/2019    There are no preventive care reminders to display for this patient.  Lab Results  Component Value Date   TSH 0.403 (L) 02/23/2020   Lab Results  Component Value Date   WBC 8.5 03/27/2020   HGB 14.7 03/27/2020   HCT 46.1 03/27/2020   MCV 86.3 03/27/2020   PLT 210 03/27/2020   Lab Results  Component Value Date   NA 139 03/27/2020   K 4.2 03/27/2020   CO2 28 03/27/2020   GLUCOSE 123 (H) 03/27/2020   BUN 13 03/27/2020   CREATININE 1.15 03/27/2020   BILITOT 0.3 02/23/2020   ALKPHOS 65 02/23/2020   AST 40 02/23/2020   ALT 28 02/23/2020   PROT 6.5 02/23/2020   ALBUMIN 4.2 02/23/2020   CALCIUM 9.8 03/27/2020   ANIONGAP 7 03/27/2020   Lab Results  Component Value Date   CHOL 134 02/23/2020   Lab Results  Component Value Date   HDL 45 02/23/2020   Lab Results  Component Value Date   LDLCALC 80 02/23/2020   Lab Results  Component Value Date   TRIG 33 02/23/2020   Lab Results  Component Value Date    CHOLHDL 3.0 02/23/2020   Lab Results  Component Value Date   HGBA1C 5.8 (H) 02/23/2020      Assessment & Plan:   Problem List Items Addressed This Visit      Respiratory   Chronic sinusitis   Relevant Medications   predniSONE (DELTASONE) 10 MG tablet   Other Relevant Orders   Ambulatory referral to ENT     Nervous and Auditory   Postherpetic neuralgia at T3-T5 level- R post chest wall (  Chronic)   Relevant Medications   pregabalin (LYRICA) 75 MG capsule    Other Visit Diagnoses    Bell's palsy    -  Primary   Relevant Medications   cyclobenzaprine (FLEXERIL) 10 MG tablet   pregabalin (LYRICA) 75 MG capsule   Other Relevant Orders   Ambulatory referral to Neurology   Nonintractable headache, unspecified chronicity pattern, unspecified headache type       Relevant Medications   cyclobenzaprine (FLEXERIL) 10 MG tablet   pregabalin (LYRICA) 75 MG capsule   Other Relevant Orders   Ambulatory referral to Neurology     Bell's palsy, Nonintractable headache: -Patient has completed high dose of prednisone and extended rx of Valtrex with continued severe facial paralysis so will place referral to neurology for further evaluation and management as well as headache evaluation. In the interim will start low dose of prednisone 10 mg for 7 days. -Recommend to continue artificial eyedrops and use an eye patch. -Recommend stress reduction techniques. Last A1c stable at 5.8 (prediabetes range).  Chronic sinusitis:  -Will place ENT referral for further evaluation and management options including surgery. -CT head Wo contrast:  IMPRESSION:   1. No evidence of acute intracranial abnormality.   2. Nonspecific frontoethmoidal and left sphenoid sinus mucosal   thickening. Correlate with signs/symptoms of sinusitis.  Postherpetic neuralgia: -Will provide refill of Lyrica 75 mg for nerve pain.   -On exam, muscle tension and spasticity noted of trapezius area so will provide prescription  for Flexeril 10 mg to take at bedtime as needed.  Meds ordered this encounter  Medications  . cyclobenzaprine (FLEXERIL) 10 MG tablet    Sig: Take 1 tablet (10 mg total) by mouth at bedtime.    Dispense:  30 tablet    Refill:  0  . predniSONE (DELTASONE) 10 MG tablet    Sig: Take 1 tablet (10 mg total) by mouth daily with breakfast.    Dispense:  7 tablet    Refill:  0  . pregabalin (LYRICA) 75 MG capsule    Sig: Take 1 capsule (75 mg total) by mouth 2 (two) times daily.    Dispense:  60 capsule    Refill:  1    Follow-up: Return if symptoms worsen or fail to improve.   Note:  This note was prepared with assistance of Dragon voice recognition software. Occasional wrong-word or sound-a-like substitutions may have occurred due to the inherent limitations of voice recognition software.  Mayer Masker, PA-C

## 2020-04-12 ENCOUNTER — Ambulatory Visit (INDEPENDENT_AMBULATORY_CARE_PROVIDER_SITE_OTHER): Payer: BC Managed Care – PPO | Admitting: Neurology

## 2020-04-12 ENCOUNTER — Other Ambulatory Visit: Payer: Self-pay

## 2020-04-12 ENCOUNTER — Encounter: Payer: Self-pay | Admitting: Neurology

## 2020-04-12 DIAGNOSIS — G510 Bell's palsy: Secondary | ICD-10-CM | POA: Diagnosis not present

## 2020-04-12 HISTORY — DX: Bell's palsy: G51.0

## 2020-04-12 MED ORDER — ARTIFICIAL TEARS OPHTHALMIC OINT: TOPICAL_OINTMENT | Freq: Every day | OPHTHALMIC | 2 refills | Status: DC

## 2020-04-12 NOTE — Progress Notes (Signed)
Reason for visit: Bells Palsy  Referring physician: Dr. Donney Rankins is a 45 y.o. male  History of present illness:  Mr. Strauch is a 45 year old black male with a history of prediabetes.  The patient was seen in the emergency room on 27 March 2020 with a left-sided Bell's palsy.  Two days prior, he began having left ear pain that was relatively severe.  The patient began having some difficulty closing the left eye the night before the ER visit.  The patient also noted some change in his ability to speak, and he had some alteration in taste sensation and noted that loud noises are more bothersome to him.  He has had a lot of irritation of the left eye with scratchiness.  He was placed on some prednisone and was given Valtrex in the emergency room.  The prednisone did help his ear pain but when the prednisone taper ended, the pain came back.  He has been placed on a 6-day course of 10 mg of prednisone currently which has been helpful.  He also developed some left neck and shoulder discomfort, he was placed on some Lyrica and this has helped.  The patient is trying to patch the eye at night, he reports some blurring of vision during the daytime.  He does have some light sensitivity with.  The patient denies any numbness or weakness of the extremities or difficulty with balance.  He has not had any double vision or difficulty with swallowing.  He has undergone a CT scan of the head in the emergency room that was unremarkable.  He comes to this office for further evaluation.  Past Medical History:  Diagnosis Date  . Bell's palsy   . Chronic sinusitis   . Seasonal allergies   . Shingles     Past Surgical History:  Procedure Laterality Date  . NASAL SINUS SURGERY      Family History  Problem Relation Age of Onset  . Hypertension Mother   . Thyroid disease Mother   . Glaucoma Father   . Cataracts Father     Social history:  reports that he quit smoking about 18 years ago. His  smoking use included cigarettes. He has a 4.00 pack-year smoking history. He has never used smokeless tobacco. He reports that he does not drink alcohol and does not use drugs.  Medications:  Prior to Admission medications   Medication Sig Start Date End Date Taking? Authorizing Provider  Cholecalciferol (VITAMIN D3) 125 MCG (5000 UT) TABS 5,000 IU OTC vitamin D3 daily. 05/19/18   Opalski, Gavin Pound, DO  cyclobenzaprine (FLEXERIL) 10 MG tablet Take 1 tablet (10 mg total) by mouth at bedtime. 04/09/20   Mayer Masker, PA-C  diazepam (VALIUM) 10 MG tablet 1 tab po 30 min prior to procedure Patient not taking: Reported on 04/09/2020 01/16/20   Riki Altes, MD  fexofenadine (ALLEGRA) 180 MG tablet Take 1 tablet (180 mg total) by mouth daily. 05/19/18   Opalski, Gavin Pound, DO  fluticasone (FLONASE) 50 MCG/ACT nasal spray 1 spray 2 times daily after neil med or AYR sinus rinses Patient not taking: Reported on 04/09/2020 05/19/18   Thomasene Lot, DO  HYDROcodone-acetaminophen (NORCO/VICODIN) 5-325 MG tablet Take 1 tablet by mouth every 6 (six) hours as needed for moderate pain. Patient not taking: Reported on 04/09/2020 01/19/20   Riki Altes, MD  loratadine-pseudoephedrine (CLARITIN-D 12-HOUR) 5-120 MG tablet Take 1 tablet by mouth 2 (two) times daily.    [provider]  multivitamin (ONE-A-DAY MEN'S) TABS tablet Take 1 tablet by mouth daily. 05/24/18   Opalski, Gavin Pound, DO  predniSONE (DELTASONE) 10 MG tablet Take 1 tablet (10 mg total) by mouth daily with breakfast. 04/09/20   Abonza, Maritza, PA-C  pregabalin (LYRICA) 75 MG capsule Take 1 capsule (75 mg total) by mouth 2 (two) times daily. 04/09/20   Mayer Masker, PA-C     No Known Allergies  ROS:  Out of a complete 14 system review of symptoms, the patient complains only of the following symptoms, and all other reviewed systems are negative.  Left Bell's palsy  Blood pressure 138/85, pulse 95, height 6\' 3"  (1.905 m), weight 256 lb  3.2 oz (116.2 kg).  Physical Exam  General: The patient is alert and cooperative at the time of the examination.  Eyes: Pupils are equal, round, and reactive to light. Discs are flat bilaterally.  Neck: The neck is supple, no carotid bruits are noted.  Respiratory: The respiratory examination is clear.  Cardiovascular: The cardiovascular examination reveals a regular rate and rhythm, no obvious murmurs or rubs are noted.  Skin: Extremities are without significant edema.  Neurologic Exam  Mental status: The patient is alert and oriented x 3 at the time of the examination. The patient has apparent normal recent and remote memory, with an apparently normal attention span and concentration ability.  Cranial nerves: Facial symmetry is not present.  The patient is unable to close eye completely, prominent left peripheral facial weakness is seen.  There is good sensation of the face to pinprick and soft touch bilaterally. The strength of the facial muscles and the muscles to head turning and shoulder shrug are normal bilaterally. Speech is well enunciated, no aphasia or dysarthria is noted. Extraocular movements are full. Visual fields are full. The tongue is midline, and the patient has symmetric elevation of the soft palate. No obvious hearing deficits are noted.  Motor: The motor testing reveals 5 over 5 strength of all 4 extremities. Good symmetric motor tone is noted throughout.  Sensory: Sensory testing is intact to pinprick, soft touch, vibration sensation, and position sense on all 4 extremities. No evidence of extinction is noted.  Coordination: Cerebellar testing reveals good finger-nose-finger and heel-to-shin bilaterally.  Gait and station: Gait is normal. Tandem gait is normal. Romberg is negative. No drift is seen.  Reflexes: Deep tendon reflexes are symmetric and normal bilaterally. Toes are downgoing bilaterally.   CT head 03/27/20:  IMPRESSION: 1. No evidence of acute  intracranial abnormality. 2. Nonspecific frontoethmoidal and left sphenoid sinus mucosal thickening. Correlate with signs/symptoms of sinusitis.  * CT scan images were reviewed online. I agree with the written report.    Assessment/Plan:  1.  Left Bell's palsy  2.  Prediabetes  The patient runs a hemoglobin A1c of 5.8.  The history of prediabetes may put him at a little high risk for Bell's palsy.  The patient has a very severe facial weakness, it may take 4 months or more for him to get full recovery and he may be at risk for aberrant regeneration.  The patient will continue the prednisone taper, he will finish up the Valtrex.  The patient will be given Lacri-Lube to use in the eye at nighttime and will patch the eye.  He will use artificial tears during the day.  He will follow up here in 3 months.  03/29/20 MD 04/12/2020 10:29 AM  [facility] [address] [place],  [place] [address]  Phone [phone] Fax [phone]

## 2020-04-12 NOTE — Patient Instructions (Signed)
Use lacrilube ointment in the left eye at night to protect the cornea.  Use artificial tears or other methylcellulose solution to wet the eye during the day.

## 2020-04-16 ENCOUNTER — Other Ambulatory Visit: Payer: Self-pay

## 2020-04-16 DIAGNOSIS — Z302 Encounter for sterilization: Secondary | ICD-10-CM

## 2020-05-29 ENCOUNTER — Other Ambulatory Visit: Payer: Self-pay

## 2020-05-29 ENCOUNTER — Encounter (INDEPENDENT_AMBULATORY_CARE_PROVIDER_SITE_OTHER): Payer: Self-pay | Admitting: Otolaryngology

## 2020-05-29 ENCOUNTER — Ambulatory Visit (INDEPENDENT_AMBULATORY_CARE_PROVIDER_SITE_OTHER): Payer: BC Managed Care – PPO | Admitting: Otolaryngology

## 2020-05-29 VITALS — Temp 97.7°F

## 2020-05-29 DIAGNOSIS — J310 Chronic rhinitis: Secondary | ICD-10-CM

## 2020-05-29 DIAGNOSIS — G510 Bell's palsy: Secondary | ICD-10-CM

## 2020-05-29 DIAGNOSIS — T485X5A Adverse effect of other anti-common-cold drugs, initial encounter: Secondary | ICD-10-CM

## 2020-05-29 NOTE — Progress Notes (Signed)
HPI: Warren Herrera is a 45 y.o. male who presents is referred by his PCP for evaluation of chronic "sinus issues".  Patient has always had "sinus problems".  He complains of difficulty breathing through his nose with thick mucus discharge to the point where he uses Afrin a couple times every day.  He also takes Claritin-D on a regular basis or is unable to breathe.  He does have history of hypertension.  He has tried Flonase in the past but this does not seem to help.  I reviewed a CT scan of his head performed in December 2021 that showed only minimal mucosal thickening within the sinuses. He apparently developed a Bell's palsy on 03/27/2020 with weakness of the left facial nerve that he thought was originally related to his sinuses.  He states that he was initially treated with steroids for several weeks but has had no improvement in facial nerve function and is still having difficulty closing his eye. He had previous sinus surgery with Dr. Haroldine Laws about 13 years ago.  He does have history of allergies.  His main complaint concerning his sinuses is difficulty breathing through his nose and thick mucus discharge..  Past Medical History:  Diagnosis Date  . Bell's palsy   . Chronic sinusitis   . Left-sided Bell's palsy 04/12/2020  . Seasonal allergies   . Shingles    Past Surgical History:  Procedure Laterality Date  . NASAL SINUS SURGERY     Social History   Socioeconomic History  . Marital status: Married    Spouse name: Not on file  . Number of children: 4  . Years of education: college  . Highest education level: Bachelor's degree (e.g., BA, AB, BS)  Occupational History  . Occupation: Optician, dispensing  Tobacco Use  . Smoking status: Former Smoker    Packs/day: 1.00    Years: 4.00    Pack years: 4.00    Types: Cigarettes    Quit date: 2004    Years since quitting: 18.1  . Smokeless tobacco: Never Used  Vaping Use  . Vaping Use: Never used  Substance and Sexual Activity  . Alcohol use:  No  . Drug use: No  . Sexual activity: Yes    Birth control/protection: None  Other Topics Concern  . Not on file  Social History Narrative   Lives with his family.   Right-handed.   Caffeine use: 4 cups per day.   Social Determinants of Health   Financial Resource Strain: Not on file  Food Insecurity: Not on file  Transportation Needs: Not on file  Physical Activity: Not on file  Stress: Not on file  Social Connections: Not on file   Family History  Problem Relation Age of Onset  . Hypertension Mother   . Thyroid disease Mother   . Glaucoma Father   . Cataracts Father    No Known Allergies Prior to Admission medications   Medication Sig Start Date End Date Taking? Authorizing Provider  artificial tears (LACRILUBE) OINT ophthalmic ointment Place into the left eye at bedtime. 04/12/20   York Spaniel, MD  Cholecalciferol (VITAMIN D3) 125 MCG (5000 UT) TABS 5,000 IU OTC vitamin D3 daily. 05/19/18   Opalski, Gavin Pound, DO  cyclobenzaprine (FLEXERIL) 10 MG tablet Take 1 tablet (10 mg total) by mouth at bedtime. 04/09/20   Mayer Masker, PA-C  fexofenadine (ALLEGRA) 180 MG tablet Take 1 tablet (180 mg total) by mouth daily. 05/19/18   Opalski, Deborah, DO  fluticasone (FLONASE) 50 MCG/ACT  nasal spray 1 spray 2 times daily after neil med or AYR sinus rinses 05/19/18   Opalski, Deborah, DO  loratadine-pseudoephedrine (CLARITIN-D 12-HOUR) 5-120 MG tablet Take 1 tablet by mouth 2 (two) times daily.    [provider]  multivitamin (ONE-A-DAY MEN'S) TABS tablet Take 1 tablet by mouth daily. 05/24/18   Opalski, Gavin Pound, DO  predniSONE (DELTASONE) 10 MG tablet Take 1 tablet (10 mg total) by mouth daily with breakfast. 04/09/20   Abonza, Maritza, PA-C  pregabalin (LYRICA) 75 MG capsule Take 1 capsule (75 mg total) by mouth 2 (two) times daily. 04/09/20   Mayer Masker, PA-C     Positive ROS: Otherwise negative  All other systems have been reviewed and were otherwise negative with  the exception of those mentioned in the HPI and as above.  Physical Exam: Constitutional: Alert, well-appearing, no acute distress.  He is breathing well through his nose presently but he used Afrin earlier today. Ears: External ears without lesions or tenderness. Ear canals are clear bilaterally with intact, clear TMs.  On tuning fork testing he heard about the same in both ears with the 1024 tuning fork. Nasal: External nose without lesions. Septum slight deformity to the left..  On nasal endoscopy the septum is slightly deviated to the left but both middle meatus regions were clear with no evidence of mucopurulent discharge.  The nasopharynx was clear on both sides.  No clinical evidence of mucopurulent discharge or active infection. Oral: Lips and gums without lesions. Tongue and palate mucosa without lesions. Posterior oropharynx clear. Neck: No palpable adenopathy or masses.  No palpable masses in the left parotid region. Patient has significant weakness of the left facial nerve as he is unable to close his eye with a very minimal if no movement of the muscles of the face. Respiratory: Breathing comfortably  Skin: No facial/neck lesions or rash noted.  Procedures  Assessment: Left Bell's palsy which initially developed on 03/27/2020 Chronic rhinitis status post previous sinus surgery with Dr. Haroldine Laws 13 years ago Rhinitis medicamentosa  Plan: Discussed with patient concerning trying to get off of the decongestant spray as he is addicted to this presently.  Recommend use of saline irrigations and either taping around the decongestant spray or stopping cold [place]. Prescribed Nasacort 2 sprays each nostril at night and would recommend using this regularly. Would also recommend getting off of the decongestant medication as this will elevate his blood pressure. We will plan on obtaining a CT scan of his sinuses and have him follow-up in 1 month for recheck. Concerning his facial nerve  weakness discussed possible evaluation with Dr. Dorma Russell who has performed facial nerve decompressions.   Narda Bonds, MD   CC:

## 2020-06-17 ENCOUNTER — Telehealth: Payer: Self-pay | Admitting: Physician Assistant

## 2020-06-17 NOTE — Telephone Encounter (Signed)
Patient is experiencing extreme dizziness and vertigo. He is unable to lift his head and his blood pressure is up he states and he has not been having blood pressure problems until now. He is not sure what is causing it. He also states his inner ears are bothering him and they are stopped up. Please advise, thanks.

## 2020-06-18 ENCOUNTER — Encounter: Payer: Self-pay | Admitting: Physician Assistant

## 2020-06-18 ENCOUNTER — Ambulatory Visit (INDEPENDENT_AMBULATORY_CARE_PROVIDER_SITE_OTHER): Payer: BC Managed Care – PPO | Admitting: Physician Assistant

## 2020-06-18 ENCOUNTER — Other Ambulatory Visit: Payer: Self-pay

## 2020-06-18 VITALS — BP 118/76 | HR 100 | Temp 98.8°F | Ht 75.0 in | Wt 258.8 lb

## 2020-06-18 DIAGNOSIS — M62838 Other muscle spasm: Secondary | ICD-10-CM

## 2020-06-18 DIAGNOSIS — G510 Bell's palsy: Secondary | ICD-10-CM | POA: Diagnosis not present

## 2020-06-18 DIAGNOSIS — B0229 Other postherpetic nervous system involvement: Secondary | ICD-10-CM

## 2020-06-18 DIAGNOSIS — H9319 Tinnitus, unspecified ear: Secondary | ICD-10-CM

## 2020-06-18 DIAGNOSIS — R42 Dizziness and giddiness: Secondary | ICD-10-CM | POA: Diagnosis not present

## 2020-06-18 DIAGNOSIS — R7989 Other specified abnormal findings of blood chemistry: Secondary | ICD-10-CM

## 2020-06-18 DIAGNOSIS — R7303 Prediabetes: Secondary | ICD-10-CM

## 2020-06-18 DIAGNOSIS — I10 Essential (primary) hypertension: Secondary | ICD-10-CM

## 2020-06-18 DIAGNOSIS — F4323 Adjustment disorder with mixed anxiety and depressed mood: Secondary | ICD-10-CM

## 2020-06-18 MED ORDER — MECLIZINE HCL 25 MG PO TABS: 25.0000 mg | ORAL_TABLET | Freq: Three times a day (TID) | ORAL | 0 refills | Status: DC | PRN

## 2020-06-18 MED ORDER — PREDNISONE 5 MG (48) PO TBPK: ORAL_TABLET | ORAL | 0 refills | Status: DC

## 2020-06-18 MED ORDER — CYCLOBENZAPRINE HCL 10 MG PO TABS: 10.0000 mg | ORAL_TABLET | Freq: Every day | ORAL | 0 refills | Status: DC

## 2020-06-18 MED ORDER — SERTRALINE HCL 25 MG PO TABS: 25.0000 mg | ORAL_TABLET | Freq: Every day | ORAL | 0 refills | Status: DC

## 2020-06-18 MED ORDER — PREGABALIN 75 MG PO CAPS: 75.0000 mg | ORAL_CAPSULE | Freq: Two times a day (BID) | ORAL | 0 refills | Status: DC

## 2020-06-18 NOTE — Patient Instructions (Addendum)
Mnire's Disease  Mnire's disease is an inner ear disorder. It causes attacks of a spinning sensation (vertigo), and ringing in the ear (tinnitus). It also causes hearing loss and a feeling of fullness or pressure in the ear. It typically affects one ear but can affect both. This is a lifelong condition, and it may get worse over time. There are treatment options to help manage the symptoms of Mnire's disease. What are the causes? This condition is caused by having too much of the fluid that is in the inner ear (endolymph). When fluid builds up in the inner ear, it affects the nerves that control balance and hearing. The reason for the fluid buildup is not known. Possible causes include:  Allergies.  An abnormal reaction of the body's defense system (autoimmune disease).  Viral infection of the inner ear.  Head injury. What increases the risk? The following factors may make you more likely to develop this condition:  Being between 105 and 20 years old.  Having a family history of Mnire's disease.  Having a history of autoimmune disease.  Having an injury to the head (head trauma). What are the signs or symptoms? Symptoms of this condition include:  Fullness and pressure in your ear.  Roaring or ringing in your ear (tinnitus).  Vertigo and loss of balance.  Decreased hearing.  Nausea and vomiting. This happens only sometimes. Symptoms of this condition can come and go and may last for up to 4 or more hours at a time. Symptoms usually start in one ear. They may become more frequent and eventually involve both ears. How is this diagnosed? This condition is diagnosed based on a physical exam and tests. Tests may include:  A hearing test (audiogram).  An electronystagmogram (ENG). This tests your balance nerve (vestibular nerve).  Imaging studies of your inner ear and hearing nerve, such as an MRI.  Other balance tests, such as rotational or balance platform tests. How  is this treated? There is no cure for this condition, but treatment can help to manage your symptoms. Treatment may include:  A low-salt (low-sodium) diet. This may help to reduce fluid in the body and relieve symptoms.  Oral or injected medicines to reduce or control: ? Vertigo. ? Nausea. ? Fluid retention.  Use of an air pressure pulse generator. This is a machine that sends small pressure pulses into your ear canal.  Hearing aids.  Inner ear surgery. This is rare. Follow these instructions at home: Eating and drinking  Avoid caffeine.  Do not drink alcohol.  Drink enough fluid to keep your urine pale yellow.  Limit the sodium in your diet as told by your health care provider. This is usually no more than 1,500-2,000 mg per day. Check ingredients and nutrition facts on packaged foods and beverages. General instructions  Take over-the-counter and prescription medicines only as told by your health care provider.  Do not drive if you have vertigo or dizziness.  Do not use any products that contain nicotine or tobacco, such as cigarettes, e-cigarettes, and chewing tobacco. If you need help quitting, ask your health care provider.  Keep all follow-up visits as told by your health care provider. This is important. Contact a health care provider if:  You have symptoms that last longer than 4 hours.  You have new or worse symptoms. Get help right away if:  You have been vomiting for 24 hours.  You cannot keep fluids down.  You have chest pain or trouble breathing. These  symptoms may represent a serious problem that is an emergency. Do not wait to see if the symptoms will go away. Get medical help right away. Call your local emergency services (911 in the [place] Do not drive yourself to the hospital. Summary  Mnire's disease is an inner ear disorder.  This condition causes attacks of a spinning sensation (vertigo), ringing in the ear (tinnitus), hearing loss, and ear  fullness.  Symptoms of this condition can come and go and may last for up to 4 or more hours at a time.  Mnire's disease can be treated with a low-sodium diet, medicines, and sometimes, surgery. This information is not intended to replace advice given to you by your health care provider. Make sure you discuss any questions you have with your health care provider. Document Revised: 01/02/2019 Document Reviewed: 01/02/2019 Elsevier Patient Education  2021 [employer]    Ramsay Hunt Syndrome  Ramsay Hunt syndrome (RHS) is a viral infection that affects the nerves in the face and the nerves near the inner ear. The infection is caused by the varicella zoster virus (VZV). This is the same virus that causes chickenpox and shingles. After a person has chickenpox, this virus may become inactive. Years later, the virus can become active again and cause Ramsay Hunt syndrome. The trigger may be something that weakens the body's defense system (immune system), like stress. When VZV becomes activated, it moves up the facial nerve and causes a painful rash in or around the ear canal. It may also travel up the nerve that is responsible for hearing. Ramsay Hunt syndrome cannot be passed from person to person (it is not contagious). However, if a person who has never had chickenpox comes in contact with fluid from someone's skin blisters, he or she may develop chickenpox. What are the causes? This condition is caused by the varicella zoster virus. What increases the risk? You may be at risk for Ramsay Hunt syndrome if you have had chickenpox in the past. What are the signs or symptoms? Symptoms of this condition include:  A rash with blisters that breaks out around the ear. The rash may be accompanied by: ? A rash in the inner ear, along the side of the face, or up the scalp. ? A rash inside the mouth. ? Deep and severe pain in the ear. ? Severe, burning pain wherever the rash develops.  Facial nerve  weakness. This may cause: ? Drooping on one side of the face. ? Inability to close the eyelid on the affected side of the face. ? Trouble eating. ? Loss of ability to taste on the side of the tongue. If RHS affects the inner ear nerve (auditory nerve), other symptoms may be present. These may include:  Hearing loss.  A spinning sensation (vertigo).  Clumsiness.  Ringing in the ear (tinnitus). How is this diagnosed? This condition may be diagnosed based on:  Your symptoms.  A physical exam.  Other tests to confirm the diagnosis. These may include: ? Viral culture. This test is done by swabbing the rash or blister to check for VZV. ? Blood tests to check for antibodies to VZV. Antibodies are proteins that your body produces in response to germs. ? Nerve conduction studies (electroneurogram). ? MRI scan. ? Hearing tests (audiology). How is this treated? This condition may be treated with:  An antiviral medicine to treat the virus.  NSAIDs or prescription pain relievers to control pain.  An anti-inflammatory medicine (steroid) called prednisone.  Antibiotic medicine,  if the rash becomes infected. If treatment starts within the first 3 days of having symptoms, it will shorten the course of the pain and rash that is caused by RHS. Treatment will also prevent your facial nerve from continuing to weaken. Without treatment, it is possible that you may not recover full use of your facial nerve. Follow these instructions at home: Medicines  Take over-the-counter and prescription medicines only as told by your health care provider.  If you were prescribed an antibiotic or antiviral medicine, take or apply it as told by your health care provider. Do not stop using the antibiotic or antiviral medicine even if your condition improves.  Do not drive or use heavy machinery while taking prescription pain medicine.  If you are taking prescription pain medicine, take actions to prevent or  treat constipation. Your health care provider may recommend that you: ? Drink enough fluid to keep your urine pale yellow. ? Eat foods that are high in fiber, such as fresh fruits and vegetables, whole grains, and beans. ? Limit foods that are high in fat and processed sugars, such as fried or sweet foods. ? Take an over-the-counter or prescription medicine for constipation. General instructions  If told by your health care provider, use artificial tears and wear an eye patch to protect your eye until you can close your eyelid again.  Do not scratch or pick at the rash.  Put a cold, wet cloth (cold compress) on the itchy area as told by your health care provider.  If you have blisters in your mouth, do not eat or drink spicy, salty, or acidic things. Soft, bland, and cold foods and beverages are easiest to swallow.  Keep all follow-up visits as told by your health care provider. This is important. Contact a health care provider if:  Your pain medicine is not helping.  You have chills or fever.  Your symptoms get worse.  Your symptoms have not gone away after 2 weeks.  You have any changes in vision. Summary  Ramsay Hunt syndrome (RHS) is a viral infection that affects the nerves in the face and the nerves near the inner ear. The infection is caused by the varicella zoster virus (VZV), which also causes chicken pox and shingles.  After a person has chickenpox, this virus may become inactive. If the inactive VZV virus becomes activated, it moves up the facial nerve and causes a painful rash in or around the ear canal. It can also cause hearing loss and facial nerve weakness, with drooping on one side of the face.  If treatment starts within the first 3 days of having symptoms, it will shorten the course of the pain and rash that are caused by RHS. Treatment will also prevent your facial nerve from continuing to weaken.  Treatments may include antiviral, steroid, and pain  medicines. This information is not intended to replace advice given to you by your health care provider. Make sure you discuss any questions you have with your health care provider. Document Revised: 05/19/2018 Document Reviewed: 05/06/2017 Elsevier Patient Education  2021 [employer].

## 2020-06-18 NOTE — Progress Notes (Signed)
Acute Office Visit  Subjective:    Patient ID: Warren Herrera, male    DOB: 1975/10/19, 45 y.o.   MRN: [id number]  Chief Complaint  Patient presents with  . Dizziness  . Spasms    HPI Patient is in today with c/o of dizziness.  Patient reports over the weekend had significant dizziness which was debilitating and had to stay in bed and rest.  Today is a better day and has been able to walk around more and be more active.  Patient expresses at length health concerns related to facial paralysis which has been present since December 2021. Also complains of ear pain and feels like he is underwater (fullness), ear ringing, intermittent blurred vision, light sensitivity, and muscle spasms (neck and shoulder). States 2 weeks ago was running low on Lyrica so did not take medication for a couple days and started having intense pain/cramps on the right side which improved once he started taking medication again. Reports was diagnosed with shingles in 2019 because of the significant pain he was having which was unilateral even though he did not have a rash and responded well to the treatment. Patient was referred to Neurology for complicated Bells' Palsy and patient was not fully satisfied with care received/evaluation. Questions if possibly Ramsay Hunt Syndrome could be contributing to his symptoms. Patient reports quality of life has greatly diminished and needs to be able to work and fulfil his tasks/functions. Has reduced his work in an effort to help with his health and symptoms. Going through his divorce has been stressful. States started acupuncture and was not able to complete all sessions due to recent dizziness. Also requesting to restart medication for mood.   Past Medical History:  Diagnosis Date  . Bell's palsy   . Chronic sinusitis   . Left-sided Bell's palsy 04/12/2020  . Seasonal allergies   . Shingles     Past Surgical History:  Procedure Laterality Date  . NASAL SINUS SURGERY       Family History  Problem Relation Age of Onset  . Hypertension Mother   . Thyroid disease Mother   . Glaucoma Father   . Cataracts Father     Social History   Socioeconomic History  . Marital status: Married    Spouse name: Not on file  . Number of children: 4  . Years of education: college  . Highest education level: Bachelor's degree (e.g., BA, AB, BS)  Occupational History  . Occupation: Company secretary  Tobacco Use  . Smoking status: Former Smoker    Packs/day: 1.00    Years: 4.00    Pack years: 4.00    Types: Cigarettes    Quit date: 2004    Years since quitting: 18.2  . Smokeless tobacco: Never Used  Vaping Use  . Vaping Use: Never used  Substance and Sexual Activity  . Alcohol use: No  . Drug use: No  . Sexual activity: Yes    Birth control/protection: None  Other Topics Concern  . Not on file  Social History Narrative   Lives with his family.   Right-handed.   Caffeine use: 4 cups per day.   Social Determinants of Health   Financial Resource Strain: Not on file  Food Insecurity: Not on file  Transportation Needs: Not on file  Physical Activity: Not on file  Stress: Not on file  Social Connections: Not on file  Intimate Partner Violence: Not on file    Outpatient Medications Prior to Visit  Medication  Sig Dispense Refill  . artificial tears (LACRILUBE) OINT ophthalmic ointment Place into the left eye at bedtime. 1 Tube 2  . Cholecalciferol (VITAMIN D3) 125 MCG (5000 UT) TABS 5,000 IU OTC vitamin D3 daily. 90 tablet 3  . fluticasone (FLONASE) 50 MCG/ACT nasal spray 1 spray 2 times daily after neil med or AYR sinus rinses 16 g 4  . loratadine-pseudoephedrine (CLARITIN-D 12-HOUR) 5-120 MG tablet Take 1 tablet by mouth 2 (two) times daily.    . multivitamin (ONE-A-DAY MEN'S) TABS tablet Take 1 tablet by mouth daily. 90 tablet 0  . cyclobenzaprine (FLEXERIL) 10 MG tablet Take 1 tablet (10 mg total) by mouth at bedtime. 30 tablet 0  . fexofenadine (ALLEGRA)  180 MG tablet Take 1 tablet (180 mg total) by mouth daily. 90 tablet 3  . predniSONE (DELTASONE) 10 MG tablet Take 1 tablet (10 mg total) by mouth daily with breakfast. 7 tablet 0  . pregabalin (LYRICA) 75 MG capsule Take 1 capsule (75 mg total) by mouth 2 (two) times daily. 60 capsule 1   No facility-administered medications prior to visit.    No Known Allergies  Review of Systems A fourteen system review of systems was performed and found to be positive as per HPI.    Objective:    Physical Exam General:  Well Developed, well nourished, in no acute distress.  Neuro:  Alert and oriented,  extra-ocular muscles intact, facial asymmetry present, unable to completely close left eye, good grip strength, clear speech, normal gait, horizontal nystagmus noted   HEENT:  Normocephalic, atraumatic, conjunctiva clear,  normal TM's of both ears, neck supple  Skin:  no gross rash, warm, pink. Cardiac:  RRR, S1 S2 wnl's  Respiratory:  ECTA B/L w/o wheezing, Not using accessory muscles, speaking in full sentences- unlabored. Vascular:  Ext warm, no cyanosis apprec.; cap RF less 2 sec. Psych:  No HI/SI, judgement and insight good, dysphoric mood. Full Affect.   BP 118/76   Pulse 100   Temp 98.8 F (37.1 C)   Ht _0  (1.905 m)   Wt 258 lb 12.8 oz (117.4 kg)   SpO2 99%   BMI 32.35 kg/m  Wt Readings from Last 3 Encounters:  06/18/20 258 lb 12.8 oz (117.4 kg)  04/12/20 256 lb 3.2 oz (116.2 kg)  04/09/20 253 lb 6.4 oz (114.9 kg)    Health Maintenance Due  Topic Date Due  . Hepatitis C Screening  Never done  . COVID-19 Vaccine (1) Never done  . HIV Screening  Never done  . TETANUS/TDAP  Never done  . INFLUENZA VACCINE  11/05/2019    There are no preventive care reminders to display for this patient.   Lab Results  Component Value Date   TSH 0.403 (L) 02/23/2020   Lab Results  Component Value Date   WBC 8.5 03/27/2020   HGB 14.7 03/27/2020   HCT 46.1 03/27/2020   MCV 86.3  03/27/2020   PLT 210 03/27/2020   Lab Results  Component Value Date   NA 139 03/27/2020   K 4.2 03/27/2020   CO2 28 03/27/2020   GLUCOSE 123 (H) 03/27/2020   BUN 13 03/27/2020   CREATININE 1.15 03/27/2020   BILITOT 0.3 02/23/2020   ALKPHOS 65 02/23/2020   AST 40 02/23/2020   ALT 28 02/23/2020   PROT 6.5 02/23/2020   ALBUMIN 4.2 02/23/2020   CALCIUM 9.8 03/27/2020   ANIONGAP 7 03/27/2020   Lab Results  Component Value Date   CHOL  134 02/23/2020   Lab Results  Component Value Date   HDL 45 02/23/2020   Lab Results  Component Value Date   LDLCALC 80 02/23/2020   Lab Results  Component Value Date   TRIG 33 02/23/2020   Lab Results  Component Value Date   CHOLHDL 3.0 02/23/2020   Lab Results  Component Value Date   HGBA1C 5.8 (H) 02/23/2020       Assessment & Plan:   Problem List Items Addressed This Visit      Cardiovascular and Mediastinum   Primary hypertension     Nervous and Auditory   Postherpetic neuralgia at T3-T5 level- R post chest wall (Chronic)   Relevant Medications   pregabalin (LYRICA) 75 MG capsule   predniSONE (STERAPRED UNI-PAK 48 TAB) 5 MG (48) TBPK tablet     Other   Prediabetes   Relevant Orders   HgB A1c   Adjustment disorder with mixed anxiety and depressed mood   Relevant Medications   sertraline (ZOLOFT) 25 MG tablet    Other Visit Diagnoses    Vertigo    -  Primary   Relevant Medications   meclizine (ANTIVERT) 25 MG tablet   Other Relevant Orders   Comp Met (CMET)   CBC w/Diff   Sedimentation rate   C-reactive protein   TSH   T4, free   T3   HgB A1c   Antinuclear Antib (ANA)   Varicella zoster antibody, IgG   Varicella zoster antibody, IgM   Lyme Ab (IgG/M) + RMSF( IgG/M)   Bell's palsy       Relevant Medications   cyclobenzaprine (FLEXERIL) 10 MG tablet   pregabalin (LYRICA) 75 MG capsule   sertraline (ZOLOFT) 25 MG tablet   predniSONE (STERAPRED UNI-PAK 48 TAB) 5 MG (48) TBPK tablet   Other Relevant  Orders   Comp Met (CMET)   CBC w/Diff   Sedimentation rate   C-reactive protein   TSH   T4, free   T3   HgB A1c   Antinuclear Antib (ANA)   Varicella zoster antibody, IgG   Varicella zoster antibody, IgM   Lyme Ab (IgG/M) + RMSF( IgG/M)   Muscle spasm       Relevant Medications   cyclobenzaprine (FLEXERIL) 10 MG tablet   Low TSH level       Relevant Orders   TSH   T4, free   T3     Vertigo, Tinnitus: -Vertigo most likely peripheral vs central given signs and symptoms of horizontal nystagmus, tinnitus and ear fullness. Advised to use meclizine as needed for dizziness. Will trial corticosteroid therapy given multiple symptoms have negatively impacted patient's quality of life. -Discussed the possibility of Meniere's Disease as etiology and to consider trial of thiazide diuretic which will also provide benefits for hypertension. -Given patient's concerns with minimal resolution of symptoms will place lab orders to r/o other etiologies such as autoimmune, tick borne illness, infection, or inflammatory condition. -Advised to obtain/update eye exam given complaints of blurred vision and light sensitivity. -Will reassess symptoms and treatment therapy in 2 weeks.  Primary hypertension: -BP initially elevated, BP recheck improved and wnl's.  -Discussed with patient to consider trial of thiazide diuretic and will further address at follow up visit in 2 weeks.  Bell's Palsy: -Patient has a complicated case of Bell's Palsy and has completed treatment. Recommend to continue with eye lubricant.  -Patient has history of prediabetes and will repeat A1c with blood work. -Advised to let me know if  decides to pursue second opinion (Neurology).  Postherpetic neuralgia: -Although uncommon, there are rare cases of herpes zoster without rash appearance. Will place lab orders for varicella zoster IgG to evaluate for past infection and IgM for acute infection.  -Patient's stress levels could be  contributing to exacerbations of neuralgia.  -Will provide refill of Lyrica.  Adjustment disorder with mixed anxiety and depressed mood: -PHQ-9 score of 10, will restart sertraline 25 mg. -Will continue to monitor and reassess at follow up visit. Depression screen [facility] 2/9 06/18/2020 04/09/2020 02/23/2020 11/17/2019 05/19/2018  Decreased Interest 2 1 0 1 0  Down, Depressed, Hopeless _0 0  PHQ - 2 Score _1 0  Altered sleeping _2 0  Tired, decreased energy _3 Change in appetite 1 0 1 0 1  Feeling bad or failure about yourself  1 1 0 1 0  Trouble concentrating 1 0 0 2 0  Moving slowly or fidgety/restless 2 1 0 0 0  Suicidal thoughts 0 0 0 0 0  PHQ-9 Score _4 Difficult doing work/chores - Very difficult Somewhat difficult Very difficult Not difficult at all      Meds ordered this encounter  Medications  . meclizine (ANTIVERT) 25 MG tablet    Sig: Take 1 tablet (25 mg total) by mouth 3 (three) times daily as needed for dizziness.    Dispense:  30 tablet    Refill:  0    Order Specific Question:   Supervising Provider    Answer:   Beatrice Lecher D [[id number]]  . cyclobenzaprine (FLEXERIL) 10 MG tablet    Sig: Take 1 tablet (10 mg total) by mouth at bedtime.    Dispense:  90 tablet    Refill:  0    Order Specific Question:   Supervising Provider    Answer:   Beatrice Lecher D [[id number]]  . pregabalin (LYRICA) 75 MG capsule    Sig: Take 1 capsule (75 mg total) by mouth 2 (two) times daily.    Dispense:  180 capsule    Refill:  0    Order Specific Question:   Supervising Provider    Answer:   Beatrice Lecher D [[id number]]  . sertraline (ZOLOFT) 25 MG tablet    Sig: Take 1 tablet (25 mg total) by mouth daily.    Dispense:  90 tablet    Refill:  0    Order Specific Question:   Supervising Provider    Answer:   Beatrice Lecher D [[id number]]  . predniSONE (STERAPRED UNI-PAK 48 TAB) 5 MG (48) TBPK tablet    Sig: 12 day taper- take as directed on pack     Dispense:  48 tablet    Refill:  0    Order Specific Question:   Supervising Provider    Answer:   Beatrice Lecher D [[id number]]     Lorrene Reid, PA-C

## 2020-06-20 ENCOUNTER — Other Ambulatory Visit: Payer: Self-pay | Admitting: Physician Assistant

## 2020-06-20 ENCOUNTER — Other Ambulatory Visit: Payer: BC Managed Care – PPO

## 2020-06-20 ENCOUNTER — Other Ambulatory Visit: Payer: Self-pay

## 2020-06-20 DIAGNOSIS — R7303 Prediabetes: Secondary | ICD-10-CM | POA: Diagnosis not present

## 2020-06-20 DIAGNOSIS — R42 Dizziness and giddiness: Secondary | ICD-10-CM | POA: Diagnosis not present

## 2020-06-20 DIAGNOSIS — R7989 Other specified abnormal findings of blood chemistry: Secondary | ICD-10-CM | POA: Diagnosis not present

## 2020-06-20 DIAGNOSIS — G510 Bell's palsy: Secondary | ICD-10-CM | POA: Diagnosis not present

## 2020-06-21 ENCOUNTER — Ambulatory Visit: Payer: 59 | Admitting: Physician Assistant

## 2020-06-22 LAB — T3: T3, Total: 95 ng/dL (ref 71–180)

## 2020-06-22 LAB — COMPREHENSIVE METABOLIC PANEL
ALT: 25 IU/L (ref 0–44)
AST: 24 IU/L (ref 0–40)
Albumin/Globulin Ratio: 1.8 (ref 1.2–2.2)
Albumin: 4.1 g/dL (ref 4.0–5.0)
Alkaline Phosphatase: 61 IU/L (ref 44–121)
BUN/Creatinine Ratio: 8 — ABNORMAL LOW (ref 9–20)
BUN: 11 mg/dL (ref 6–24)
Bilirubin Total: <0.2 mg/dL (ref 0.0–1.2)
CO2: 25 mmol/L (ref 20–29)
Calcium: 9.3 mg/dL (ref 8.7–10.2)
Chloride: 105 mmol/L (ref 96–106)
Creatinine, Ser: 1.33 mg/dL — ABNORMAL HIGH (ref 0.76–1.27)
Globulin, Total: 2.3 g/dL (ref 1.5–4.5)
Glucose: 87 mg/dL (ref 65–99)
Potassium: 4.3 mmol/L (ref 3.5–5.2)
Sodium: 143 mmol/L (ref 134–144)
Total Protein: 6.4 g/dL (ref 6.0–8.5)
eGFR: 68 mL/min/{1.73_m2} (ref >59)

## 2020-06-22 LAB — CBC WITH DIFFERENTIAL/PLATELET
Basophils Absolute: 0 10*3/uL (ref 0.0–0.2)
Basos: 0 %
EOS (ABSOLUTE): 0.1 10*3/uL (ref 0.0–0.4)
Eos: 1 %
Hematocrit: 41.9 % (ref 37.5–51.0)
Hemoglobin: 13.6 g/dL (ref 13.0–17.7)
Immature Grans (Abs): 0 10*3/uL (ref 0.0–0.1)
Immature Granulocytes: 0 %
Lymphocytes Absolute: 2.4 10*3/uL (ref 0.7–3.1)
Lymphs: 40 %
MCH: 28 pg (ref 26.6–33.0)
MCHC: 32.5 g/dL (ref 31.5–35.7)
MCV: 86 fL (ref 79–97)
Monocytes Absolute: 0.3 10*3/uL (ref 0.1–0.9)
Monocytes: 6 %
Neutrophils Absolute: 3.1 10*3/uL (ref 1.4–7.0)
Neutrophils: 53 %
Platelets: 205 10*3/uL (ref 150–450)
RBC: 4.86 x10E6/uL (ref 4.14–5.80)
RDW: 13.3 % (ref 11.6–15.4)
WBC: 5.9 10*3/uL (ref 3.4–10.8)

## 2020-06-22 LAB — VARICELLA ZOSTER ANTIBODY, IGG: Varicella zoster IgG: 2358 index (ref >165)

## 2020-06-22 LAB — TSH: TSH: 0.38 u[IU]/mL — ABNORMAL LOW (ref 0.450–4.500)

## 2020-06-22 LAB — HEMOGLOBIN A1C
Est. average glucose Bld gHb Est-mCnc: 117 mg/dL
Hgb A1c MFr Bld: 5.7 % — ABNORMAL HIGH (ref 4.8–5.6)

## 2020-06-22 LAB — LYMEAB(IGG/M)+RMSF(IGG/M)
LYME DISEASE AB, QUANT, IGM: <0.8 index (ref 0.00–0.79)
Lyme IgG/IgM Ab: <0.91 {ISR} (ref 0.00–0.90)
RMSF IgG: NEGATIVE
RMSF IgM: 0.46 index (ref 0.00–0.89)

## 2020-06-22 LAB — T4, FREE: Free T4: 1.18 ng/dL (ref 0.82–1.77)

## 2020-06-22 LAB — ANA: Anti Nuclear Antibody (ANA): NEGATIVE

## 2020-06-22 LAB — C-REACTIVE PROTEIN: CRP: <1 mg/L (ref 0–10)

## 2020-06-22 LAB — SEDIMENTATION RATE: Sed Rate: 3 mm/hr (ref 0–15)

## 2020-06-22 LAB — VARICELLA ZOSTER ANTIBODY, IGM: Varicella IgM: <0.91 index (ref 0.00–0.90)

## 2020-06-26 ENCOUNTER — Ambulatory Visit (INDEPENDENT_AMBULATORY_CARE_PROVIDER_SITE_OTHER): Payer: BC Managed Care – PPO | Admitting: Otolaryngology

## 2020-07-03 ENCOUNTER — Encounter: Payer: Self-pay | Admitting: Physician Assistant

## 2020-07-03 ENCOUNTER — Ambulatory Visit (INDEPENDENT_AMBULATORY_CARE_PROVIDER_SITE_OTHER): Payer: BC Managed Care – PPO | Admitting: Physician Assistant

## 2020-07-03 ENCOUNTER — Other Ambulatory Visit: Payer: Self-pay

## 2020-07-03 VITALS — BP 146/91 | HR 104 | Temp 98.0°F | Ht 75.0 in | Wt 259.0 lb

## 2020-07-03 DIAGNOSIS — R42 Dizziness and giddiness: Secondary | ICD-10-CM

## 2020-07-03 DIAGNOSIS — I10 Essential (primary) hypertension: Secondary | ICD-10-CM

## 2020-07-03 DIAGNOSIS — H9319 Tinnitus, unspecified ear: Secondary | ICD-10-CM

## 2020-07-03 DIAGNOSIS — G510 Bell's palsy: Secondary | ICD-10-CM

## 2020-07-03 DIAGNOSIS — F4323 Adjustment disorder with mixed anxiety and depressed mood: Secondary | ICD-10-CM

## 2020-07-03 MED ORDER — HYDROCHLOROTHIAZIDE 12.5 MG PO TABS: 12.5000 mg | ORAL_TABLET | Freq: Every day | ORAL | 0 refills | Status: DC

## 2020-07-03 NOTE — Progress Notes (Signed)
Established Patient Office Visit  Subjective:  Patient ID: Warren Herrera, male    DOB: 1975-12-25  Age: 45 y.o. MRN: [id number]  CC:  Chief Complaint  Patient presents with  . Follow-up  . Hypertension  . Dizziness  . Bell's pasly     HPI Warren Herrera presents for follow up on vertigo, hypertension and Bell's palsy. Patient reports dizziness has improved. Continues to have episodes but not as severe. Continues to have constant ear buzzing predominantly in left ear, sometimes on and off of right ear. Has resumed acupuncture sessions and herbal teas as well as facial exercises which feels have helped some. Feels like acupuncture has also helped with his shoulder pain/muscle tension. Reports when restarting Sertraline noticed side effects of sexual dysfunction and prefers to discontinue medication. Feels like stress contributes to his blood pressure elevations and symptoms. Expresses concern about starting blood pressure medication and long term complications.   Past Medical History:  Diagnosis Date  . Bell's palsy   . Chronic sinusitis   . Left-sided Bell's palsy 04/12/2020  . Seasonal allergies   . Shingles     Past Surgical History:  Procedure Laterality Date  . NASAL SINUS SURGERY      Family History  Problem Relation Age of Onset  . Hypertension Mother   . Thyroid disease Mother   . Glaucoma Father   . Cataracts Father     Social History   Socioeconomic History  . Marital status: Married    Spouse name: Not on file  . Number of children: 4  . Years of education: college  . Highest education level: Bachelor's degree (e.g., BA, AB, BS)  Occupational History  . Occupation: Optician, dispensing  Tobacco Use  . Smoking status: Former Smoker    Packs/day: 1.00    Years: 4.00    Pack years: 4.00    Types: Cigarettes    Quit date: 2004    Years since quitting: 18.2  . Smokeless tobacco: Never Used  Vaping Use  . Vaping Use: Never used  Substance and Sexual Activity  .  Alcohol use: No  . Drug use: No  . Sexual activity: Yes    Birth control/protection: None  Other Topics Concern  . Not on file  Social History Narrative   Lives with his family.   Right-handed.   Caffeine use: 4 cups per day.   Social Determinants of Health   Financial Resource Strain: Not on file  Food Insecurity: Not on file  Transportation Needs: Not on file  Physical Activity: Not on file  Stress: Not on file  Social Connections: Not on file  Intimate Partner Violence: Not on file    Outpatient Medications Prior to Visit  Medication Sig Dispense Refill  . Cholecalciferol (VITAMIN D3) 125 MCG (5000 UT) TABS 5,000 IU OTC vitamin D3 daily. 90 tablet 3  . cyclobenzaprine (FLEXERIL) 10 MG tablet Take 1 tablet (10 mg total) by mouth at bedtime. 90 tablet 0  . fluticasone (FLONASE) 50 MCG/ACT nasal spray 1 spray 2 times daily after neil med or AYR sinus rinses 16 g 4  . loratadine-pseudoephedrine (CLARITIN-D 12-HOUR) 5-120 MG tablet Take 1 tablet by mouth 2 (two) times daily.    . meclizine (ANTIVERT) 25 MG tablet Take 1 tablet (25 mg total) by mouth 3 (three) times daily as needed for dizziness. 30 tablet 0  . multivitamin (ONE-A-DAY MEN'S) TABS tablet Take 1 tablet by mouth daily. 90 tablet 0  . predniSONE (STERAPRED UNI-PAK  48 TAB) 5 MG (48) TBPK tablet 12 day taper- take as directed on pack 48 tablet 0  . pregabalin (LYRICA) 75 MG capsule Take 1 capsule (75 mg total) by mouth 2 (two) times daily. 180 capsule 0  . sertraline (ZOLOFT) 25 MG tablet Take 1 tablet (25 mg total) by mouth daily. 90 tablet 0  . artificial tears (LACRILUBE) OINT ophthalmic ointment Place into the left eye at bedtime. (Patient not taking: Reported on 07/03/2020) 1 Tube 2   No facility-administered medications prior to visit.    No Known Allergies  ROS Review of Systems A fourteen system review of systems was performed and found to be positive as per HPI.   Objective:    Physical Exam General:   Well Developed, in no acute distress, non-toxic appearing Neuro:  Alert and oriented,  extra-ocular muscles intact, +left facial droop   HEENT:  Normocephalic, atraumatic, neck supple Skin:  no gross rash, warm, pink. Cardiac:  RRR, S1 S2 wln's w/o murmur Respiratory:  ECTA B/L w/o wheezing, Not using accessory muscles, speaking in full sentences- unlabored. Vascular:  Ext warm, no cyanosis apprec.; cap RF less 2 sec. Psych:  No HI/SI, judgement and insight good, Euthymic mood. Full Affect.   BP (!) 146/91   Pulse (!) 104   Temp 98 F (36.7 C)   Ht 6\' 3"  (1.905 m)   Wt 259 lb (117.5 kg)   SpO2 100%   BMI 32.37 kg/m  Wt Readings from Last 3 Encounters:  07/03/20 259 lb (117.5 kg)  06/18/20 258 lb 12.8 oz (117.4 kg)  04/12/20 256 lb 3.2 oz (116.2 kg)     Health Maintenance Due  Topic Date Due  . Hepatitis C Screening  Never done  . COVID-19 Vaccine (1) Never done  . HIV Screening  Never done  . TETANUS/TDAP  Never done    There are no preventive care reminders to display for this patient.  Lab Results  Component Value Date   TSH 0.380 (L) 06/20/2020   Lab Results  Component Value Date   WBC 5.9 06/20/2020   HGB 13.6 06/20/2020   HCT 41.9 06/20/2020   MCV 86 06/20/2020   PLT 205 06/20/2020   Lab Results  Component Value Date   NA 143 06/20/2020   K 4.3 06/20/2020   CO2 25 06/20/2020   GLUCOSE 87 06/20/2020   BUN 11 06/20/2020   CREATININE 1.33 (H) 06/20/2020   BILITOT <0.2 06/20/2020   ALKPHOS 61 06/20/2020   AST 24 06/20/2020   ALT 25 06/20/2020   PROT 6.4 06/20/2020   ALBUMIN 4.1 06/20/2020   CALCIUM 9.3 06/20/2020   ANIONGAP 7 03/27/2020   Lab Results  Component Value Date   CHOL 134 02/23/2020   Lab Results  Component Value Date   HDL 45 02/23/2020   Lab Results  Component Value Date   LDLCALC 80 02/23/2020   Lab Results  Component Value Date   TRIG 33 02/23/2020   Lab Results  Component Value Date   CHOLHDL 3.0 02/23/2020   Lab  Results  Component Value Date   HGBA1C 5.7 (H) 06/20/2020      Assessment & Plan:   Problem List Items Addressed This Visit      Cardiovascular and Mediastinum   Primary hypertension   Relevant Medications   hydrochlorothiazide (HYDRODIURIL) 12.5 MG tablet    Other Visit Diagnoses    Vertigo    -  Primary   Bell's palsy  Primary hypertension: -BP elevated. Discussed with patient a trial of thiazide diuretic for blood pressure and potentially for Meniere diease. Addressed medication concerns and discussed if blood pressure remains well controlled once starting medication then we can trial managing with diet and lifestyle modifications. Patient verbalized understanding and agreeable.  -Recommend to continue to exercise and encourage to incorporate stress reduction techniques. -Follow up in 4 weeks and will repeat CMP.  Vertigo, Tinnitus: -Dizziness has improved. Patient continues to have tinnitus and is agreeable to trial HCTZ for possible Meniere disease. -Follow up in 4 weeks.  Bell's Palsy: -Encourage to continue with acupuncture sessions if beneficial and improving symptoms.   Adjustment disorder with mixed anxiety and depressed mood: -Will discontinue Sertraline due to side effects and will monitor symptoms at this time. Denies SI/HI. -Recommend to continue with nonpharmacologic therapy such as exercise. -Will reassess at follow up visit.    Meds ordered this encounter  Medications  . hydrochlorothiazide (HYDRODIURIL) 12.5 MG tablet    Sig: Take 1 tablet (12.5 mg total) by mouth daily.    Dispense:  30 tablet    Refill:  0    Order Specific Question:   Supervising Provider    Answer:   Nani Gasser D [[id number]]    Follow-up: Return in about 4 weeks (around 07/31/2020) for HTN, vertigo/tinnitus .    Mayer Masker, PA-C

## 2020-07-18 ENCOUNTER — Encounter: Payer: Self-pay | Admitting: Neurology

## 2020-07-18 ENCOUNTER — Telehealth: Payer: Self-pay | Admitting: Neurology

## 2020-07-18 ENCOUNTER — Ambulatory Visit: Payer: BC Managed Care – PPO | Admitting: Neurology

## 2020-07-18 NOTE — Telephone Encounter (Signed)
This patient did not show for a revisit appointment today. 

## 2020-07-24 ENCOUNTER — Telehealth: Payer: Self-pay | Admitting: Physician Assistant

## 2020-07-24 DIAGNOSIS — R42 Dizziness and giddiness: Secondary | ICD-10-CM

## 2020-07-24 MED ORDER — MECLIZINE HCL 25 MG PO TABS: 25.0000 mg | ORAL_TABLET | Freq: Three times a day (TID) | ORAL | 1 refills | Status: DC | PRN

## 2020-07-24 NOTE — Telephone Encounter (Signed)
Refill sent to requested pharmacy. AS, CMA 

## 2020-07-24 NOTE — Addendum Note (Signed)
Addended by: Sylvester Harder on: 07/24/2020 09:15 AM   Modules accepted: Orders

## 2020-07-24 NOTE — Telephone Encounter (Signed)
Patient needs a refill on Meclizine. Patient would like this prescription sent to [facility] on [address] in [place]. Please advise, thanks.

## 2020-07-29 ENCOUNTER — Telehealth: Payer: Self-pay | Admitting: Physician Assistant

## 2020-07-29 NOTE — Telephone Encounter (Signed)
Patient should schedule an appointment with the provider to discuss these issues and FMLA paperwork.
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
## 2020-07-29 NOTE — Telephone Encounter (Signed)
Left voicemail for patient to call and schedule

## 2020-07-31 ENCOUNTER — Other Ambulatory Visit: Payer: Self-pay | Admitting: Physician Assistant

## 2020-07-31 DIAGNOSIS — I10 Essential (primary) hypertension: Secondary | ICD-10-CM

## 2020-08-06 ENCOUNTER — Encounter: Payer: Self-pay | Admitting: Physician Assistant

## 2020-08-06 ENCOUNTER — Other Ambulatory Visit: Payer: Self-pay

## 2020-08-06 ENCOUNTER — Ambulatory Visit: Payer: BC Managed Care – PPO | Admitting: Physician Assistant

## 2020-08-06 VITALS — BP 118/77 | HR 88 | Temp 97.7°F | Ht 75.0 in | Wt 259.8 lb

## 2020-08-06 DIAGNOSIS — G510 Bell's palsy: Secondary | ICD-10-CM

## 2020-08-06 DIAGNOSIS — Z566 Other physical and mental strain related to work: Secondary | ICD-10-CM | POA: Diagnosis not present

## 2020-08-06 DIAGNOSIS — R42 Dizziness and giddiness: Secondary | ICD-10-CM | POA: Diagnosis not present

## 2020-08-06 DIAGNOSIS — I10 Essential (primary) hypertension: Secondary | ICD-10-CM | POA: Diagnosis not present

## 2020-08-06 NOTE — Progress Notes (Signed)
Established Patient Office Visit  Subjective:  Patient ID: Warren Herrera, male    DOB: 1976/02/04  Age: 45 y.o. MRN: [id number]  CC:  Chief Complaint  Patient presents with  . Follow-up    HPI Warren Herrera presents for discussion of FMLA forms. Patient reports recently returned from a two week vacation and realized how much of his stress levels are related to his job. The past couple of days has overall felt much better and has not been experiencing as frequent dizziness as before. Patient continues to recover from severe Bell's palsy (onset was 03/2020). 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 Reports has experienced " systemic racism."  Gives an example of finding razor blades within his masks. States has been working in a hostile work environment for the last 3 years and is always concerned about work.  Patient states had a significant argument/disagreement with his general manager which triggered his vertigo, elevated his blood pressure to 190/90 and felt like he was going to almost pass out. Patient reports used to work 60-70 hours per week but since the onset of his Bell's palsy and vertigo has reduced this work schedule to 50-55 hours as an effort to reduce his stress levels and be able to focus more on improving his health so he can hopefully make a good recovery from Bells' palsy. However, his general manager is not fully supportive of a flexible schedule. Patient reports has vertigo episodes about once-twice per week. Takes meclizine which does provide mild-moderate relief. However, when episodes are severe usually has to rest.  Is not safe to drive.  Past Medical History:  Diagnosis Date  . Bell's palsy   . Chronic sinusitis   . Left-sided Bell's palsy 04/12/2020  . Seasonal allergies   . Shingles     Past Surgical History:  Procedure Laterality Date  . NASAL SINUS SURGERY      Family History  Problem Relation Age of Onset  . Hypertension Mother   . Thyroid disease  Mother   . Glaucoma Father   . Cataracts Father     Social History   Socioeconomic History  . Marital status: Married    Spouse name: Not on file  . Number of children: 4  . Years of education: college  . Highest education level: Bachelor's degree (e.g., BA, AB, BS)  Occupational History  . Occupation: Company secretary  Tobacco Use  . Smoking status: Former Smoker    Packs/day: 1.00    Years: 4.00    Pack years: 4.00    Types: Cigarettes    Quit date: 2004    Years since quitting: 18.3  . Smokeless tobacco: Never Used  Vaping Use  . Vaping Use: Never used  Substance and Sexual Activity  . Alcohol use: No  . Drug use: No  . Sexual activity: Yes    Birth control/protection: None  Other Topics Concern  . Not on file  Social History Narrative   Lives with his family.   Right-handed.   Caffeine use: 4 cups per day.   Social Determinants of Health   Financial Resource Strain: Not on file  Food Insecurity: Not on file  Transportation Needs: Not on file  Physical Activity: Not on file  Stress: Not on file  Social Connections: Not on file  Intimate Partner Violence: Not on file    Outpatient Medications Prior to Visit  Medication Sig Dispense Refill  . artificial tears (LACRILUBE) OINT ophthalmic ointment Place into  the left eye at bedtime. 1 Tube 2  . Cholecalciferol (VITAMIN D3) 125 MCG (5000 UT) TABS 5,000 IU OTC vitamin D3 daily. 90 tablet 3  . cyclobenzaprine (FLEXERIL) 10 MG tablet Take 1 tablet (10 mg total) by mouth at bedtime. 90 tablet 0  . fluticasone (FLONASE) 50 MCG/ACT nasal spray 1 spray 2 times daily after neil med or AYR sinus rinses 16 g 4  . hydrochlorothiazide (HYDRODIURIL) 12.5 MG tablet Take 1 tablet by mouth once daily 30 tablet 0  . loratadine-pseudoephedrine (CLARITIN-D 12-HOUR) 5-120 MG tablet Take 1 tablet by mouth 2 (two) times daily.    . meclizine (ANTIVERT) 25 MG tablet Take 1 tablet (25 mg total) by mouth 3 (three) times daily as needed for  dizziness. 30 tablet 1  . multivitamin (ONE-A-DAY MEN'S) TABS tablet Take 1 tablet by mouth daily. 90 tablet 0  . predniSONE (STERAPRED UNI-PAK 48 TAB) 5 MG (48) TBPK tablet 12 day taper- take as directed on pack 48 tablet 0  . pregabalin (LYRICA) 75 MG capsule Take 1 capsule (75 mg total) by mouth 2 (two) times daily. 180 capsule 0   No facility-administered medications prior to visit.    No Known Allergies  ROS Review of Systems Review of Systems:  A fourteen system review of systems was performed and found to be positive as per HPI.   Objective:    Physical Exam General:  Well developed, in no acute distress, non-toxic appearing   Neuro:  Alert and oriented,  facial muscle paralysis (L) HEENT:  Normocephalic, atraumatic, neck supple Skin:  no gross rash, warm, pink. Cardiac:  RRR, S1 S2 wnl's, no murmur  Respiratory:  ECTA B/L and A/P, Not using accessory muscles, speaking in full sentences- unlabored. Vascular:  Ext warm, no cyanosis apprec.; cap RF less 2 sec. Psych:  No HI/SI, judgement and insight good, Euthymic mood. Full Affect.   BP 118/77   Pulse 88   Temp 97.7 F (36.5 C)   Ht $R'6\' 3"'aO$  (1.905 m)   Wt 259 lb 12.8 oz (117.8 kg)   SpO2 99%   BMI 32.47 kg/m  Wt Readings from Last 3 Encounters:  08/06/20 259 lb 12.8 oz (117.8 kg)  07/03/20 259 lb (117.5 kg)  06/18/20 258 lb 12.8 oz (117.4 kg)     Health Maintenance Due  Topic Date Due  . TETANUS/TDAP  Never done    There are no preventive care reminders to display for this patient.  Lab Results  Component Value Date   TSH 0.380 (L) 06/20/2020   Lab Results  Component Value Date   WBC 5.9 06/20/2020   HGB 13.6 06/20/2020   HCT 41.9 06/20/2020   MCV 86 06/20/2020   PLT 205 06/20/2020   Lab Results  Component Value Date   NA 143 06/20/2020   K 4.3 06/20/2020   CO2 25 06/20/2020   GLUCOSE 87 06/20/2020   BUN 11 06/20/2020   CREATININE 1.33 (H) 06/20/2020   BILITOT <0.2 06/20/2020   ALKPHOS 61  06/20/2020   AST 24 06/20/2020   ALT 25 06/20/2020   PROT 6.4 06/20/2020   ALBUMIN 4.1 06/20/2020   CALCIUM 9.3 06/20/2020   ANIONGAP 7 03/27/2020   EGFR 68 06/20/2020   Lab Results  Component Value Date   CHOL 134 02/23/2020   Lab Results  Component Value Date   HDL 45 02/23/2020   Lab Results  Component Value Date   LDLCALC 80 02/23/2020   Lab Results  Component  Value Date   TRIG 33 02/23/2020   Lab Results  Component Value Date   CHOLHDL 3.0 02/23/2020   Lab Results  Component Value Date   HGBA1C 5.7 (H) 06/20/2020      Assessment & Plan:   Problem List Items Addressed This Visit      Cardiovascular and Mediastinum   Primary hypertension - Primary    Other Visit Diagnoses    Vertigo       Bell's palsy       Stress at work         Primary hypertension: -BP today is at goal.  Recommend to continue hydrochlorothiazide 12.5 mg. -Recommend to continue stress reduction techniques. -Will continue to monitor.  Vertigo, Bell's Palsy, Stress at work: -Discussed with patient will complete FMLA forms for Bells' Palsy and Vertigo. Patient has risk factors for Bell's palsy including hypertension and prediabetes. Stress weakens the immune system and can contribute to Bells' palsy, onset of clinical recovery can be demonstrated within 4-6 months of symptom onset or longer for severe cases. If Bell's palsy was stress induced, then patient will benefit from reducing stress levels and learning stress coping mechanisms.    -Continue meclizine as needed for dizziness. If symptoms fail to improve or worsen recommend to follow up with ENT or referral to PT for vestibular rehabilitation.  -Recommend to contact HR about work discrimination concerns.  -Follow up as scheduled.    No orders of the defined types were placed in this encounter.   Follow-up: Return for as scheduled .    Lorrene Reid, PA-C

## 2020-08-14 ENCOUNTER — Other Ambulatory Visit: Payer: Self-pay

## 2020-08-14 ENCOUNTER — Encounter: Payer: Self-pay | Admitting: Physician Assistant

## 2020-08-14 ENCOUNTER — Ambulatory Visit: Payer: BC Managed Care – PPO | Admitting: Physician Assistant

## 2020-08-14 VITALS — BP 124/79 | HR 81 | Temp 99.1°F | Ht 75.0 in | Wt 257.4 lb

## 2020-08-14 DIAGNOSIS — G510 Bell's palsy: Secondary | ICD-10-CM

## 2020-08-14 DIAGNOSIS — F439 Reaction to severe stress, unspecified: Secondary | ICD-10-CM

## 2020-08-14 DIAGNOSIS — I10 Essential (primary) hypertension: Secondary | ICD-10-CM | POA: Diagnosis not present

## 2020-08-14 DIAGNOSIS — J329 Chronic sinusitis, unspecified: Secondary | ICD-10-CM

## 2020-08-14 DIAGNOSIS — R42 Dizziness and giddiness: Secondary | ICD-10-CM | POA: Diagnosis not present

## 2020-08-14 DIAGNOSIS — F419 Anxiety disorder, unspecified: Secondary | ICD-10-CM

## 2020-08-14 DIAGNOSIS — J310 Chronic rhinitis: Secondary | ICD-10-CM

## 2020-08-14 MED ORDER — LORATADINE-PSEUDOEPHEDRINE ER 5-120 MG PO TB12: 1.0000 | ORAL_TABLET | Freq: Two times a day (BID) | ORAL | 0 refills | Status: AC

## 2020-08-14 NOTE — Progress Notes (Signed)
Established Patient Office Visit  Subjective:  Patient ID: Warren Herrera, male    DOB: 02/10/1976  Age: 45 y.o. MRN: [id number]  CC:  Chief Complaint  Patient presents with  . Hypertension  . Dizziness  . Tinnitus    HPI Warren Herrera presents for follow up on hypertension and vertigo. Patient reports is working on coping with his stress levels and anxiety. Has created a relaxation space at his home to help with relaxing. Reports is trying to adjust to a new lifestyle but work is a significant contributor to his stress. Describes an incident in April where his son  was planning to do an internship at the car dealership as his Environmental consultant (son is a business major) to help especially with his health issues, and patient got approval from the Health and safety inspector, however, no communication of internship was communicated to Sales promotion account executive and business owner, therefore, his son had to leave. States until recently the adequate process for internship approval was done. Patient describes at length how sometimes others do no see the significant impairment of his symptoms and situation. Continues acupuncture sessions for Bell's palsy which he feels has helped some with improving his smile .However, is concerned about his prognosis given it is going to be 6 months next month since his symptom onset.   HTN: Pt denies chest pain, palpitations, orthopnea, or lower extremity swelling. Reports has been taking HCTZ 12.5 mg every other day. Reports does not like taking medications and is willing to continue with antihypertensive every other day. Pt has a moderate exercise routine in the mornings.   Vertigo: Reports takes meclizine about once-twice daily. States dizziness has improved, not as severe and debilitating. Patient states has thought about his symptoms and feels like he has had vertigo for a long time, he was thinking it was sinus pressure but has realized symptoms are more consistent with vertigo. Continues  to have ear ringing.   Chronic sinusitis: Patient reports plans to follow up with ENT to further discuss chronic sinus issues, which he feels is contributing to some of his dizziness. Verbalizes awareness of risks associated with chronic use of Afrin but has tried other nasal sprays including Flonase which have been ineffective. States the only medications that will help are Claritin-D and Afrin. Reports insurance will not cover CT of sinus but knows will likely have to pay out of pocket in order to obtain it to evaluate his sinuses and determine if surgery is needed.  Past Medical History:  Diagnosis Date  . Bell's palsy   . Chronic sinusitis   . Left-sided Bell's palsy 04/12/2020  . Seasonal allergies   . Shingles     Past Surgical History:  Procedure Laterality Date  . NASAL SINUS SURGERY      Family History  Problem Relation Age of Onset  . Hypertension Mother   . Thyroid disease Mother   . Glaucoma Father   . Cataracts Father     Social History   Socioeconomic History  . Marital status: Married    Spouse name: Not on file  . Number of children: 4  . Years of education: college  . Highest education level: Bachelor's degree (e.g., BA, AB, BS)  Occupational History  . Occupation: Company secretary  Tobacco Use  . Smoking status: Former Smoker    Packs/day: 1.00    Years: 4.00    Pack years: 4.00    Types: Cigarettes    Quit date: 2004    Years since  quitting: 18.3  . Smokeless tobacco: Never Used  Vaping Use  . Vaping Use: Never used  Substance and Sexual Activity  . Alcohol use: No  . Drug use: No  . Sexual activity: Yes    Birth control/protection: None  Other Topics Concern  . Not on file  Social History Narrative   Lives with his family.   Right-handed.   Caffeine use: 4 cups per day.   Social Determinants of Health   Financial Resource Strain: Not on file  Food Insecurity: Not on file  Transportation Needs: Not on file  Physical Activity: Not on file   Stress: Not on file  Social Connections: Not on file  Intimate Partner Violence: Not on file    Outpatient Medications Prior to Visit  Medication Sig Dispense Refill  . Cholecalciferol (VITAMIN D3) 125 MCG (5000 UT) TABS 5,000 IU OTC vitamin D3 daily. 90 tablet 3  . fluticasone (FLONASE) 50 MCG/ACT nasal spray 1 spray 2 times daily after neil med or AYR sinus rinses 16 g 4  . hydrochlorothiazide (HYDRODIURIL) 12.5 MG tablet Take 1 tablet by mouth once daily 30 tablet 0  . meclizine (ANTIVERT) 25 MG tablet Take 1 tablet (25 mg total) by mouth 3 (three) times daily as needed for dizziness. 30 tablet 1  . multivitamin (ONE-A-DAY MEN'S) TABS tablet Take 1 tablet by mouth daily. 90 tablet 0  . pregabalin (LYRICA) 75 MG capsule Take 1 capsule (75 mg total) by mouth 2 (two) times daily. 180 capsule 0  . artificial tears (LACRILUBE) OINT ophthalmic ointment Place into the left eye at bedtime. (Patient not taking: Reported on 08/14/2020) 1 Tube 2  . cyclobenzaprine (FLEXERIL) 10 MG tablet Take 1 tablet (10 mg total) by mouth at bedtime. (Patient not taking: Reported on 08/14/2020) 90 tablet 0  . loratadine-pseudoephedrine (CLARITIN-D 12-HOUR) 5-120 MG tablet Take 1 tablet by mouth 2 (two) times daily.    . predniSONE (STERAPRED UNI-PAK 48 TAB) 5 MG (48) TBPK tablet 12 day taper- take as directed on pack (Patient not taking: Reported on 08/14/2020) 48 tablet 0   No facility-administered medications prior to visit.    No Known Allergies  ROS Review of Systems A fourteen system review of systems was performed and found to be positive as per HPI.   Objective:    Physical Exam General:  Well Developed, well nourished, in no acute distress   Neuro:  Alert and oriented,  Facial paralysis of left side with improved facial droop  HEENT:  Normocephalic, atraumatic, neck supple Skin:  no gross rash, warm, pink. Cardiac:  RRR, S1 S2 wnl's, no murmur  Respiratory:  ECTA B/L w/o wheezing, crackles or  rales, Not using accessory muscles, speaking in full sentences- unlabored. Vascular:  Ext warm, no cyanosis apprec.; cap RF less 2 sec. Psych:  No HI/SI, judgement and insight good, Euthymic mood. Full Affect.   BP 124/79   Pulse 81   Temp 99.1 F (37.3 C)   Ht $R'6\' 3"'pG$  (1.905 m)   Wt 257 lb 6.4 oz (116.8 kg)   SpO2 100%   BMI 32.17 kg/m  Wt Readings from Last 3 Encounters:  08/14/20 257 lb 6.4 oz (116.8 kg)  08/06/20 259 lb 12.8 oz (117.8 kg)  07/03/20 259 lb (117.5 kg)     Health Maintenance Due  Topic Date Due  . TETANUS/TDAP  Never done    There are no preventive care reminders to display for this patient.  Lab Results  Component Value  Date   TSH 0.380 (L) 06/20/2020   Lab Results  Component Value Date   WBC 5.9 06/20/2020   HGB 13.6 06/20/2020   HCT 41.9 06/20/2020   MCV 86 06/20/2020   PLT 205 06/20/2020   Lab Results  Component Value Date   NA 143 06/20/2020   K 4.3 06/20/2020   CO2 25 06/20/2020   GLUCOSE 87 06/20/2020   BUN 11 06/20/2020   CREATININE 1.33 (H) 06/20/2020   BILITOT <0.2 06/20/2020   ALKPHOS 61 06/20/2020   AST 24 06/20/2020   ALT 25 06/20/2020   PROT 6.4 06/20/2020   ALBUMIN 4.1 06/20/2020   CALCIUM 9.3 06/20/2020   ANIONGAP 7 03/27/2020   EGFR 68 06/20/2020   Lab Results  Component Value Date   CHOL 134 02/23/2020   Lab Results  Component Value Date   HDL 45 02/23/2020   Lab Results  Component Value Date   LDLCALC 80 02/23/2020   Lab Results  Component Value Date   TRIG 33 02/23/2020   Lab Results  Component Value Date   CHOLHDL 3.0 02/23/2020   Lab Results  Component Value Date   HGBA1C 5.7 (H) 06/20/2020      Assessment & Plan:   Problem List Items Addressed This Visit      Cardiovascular and Mediastinum   Primary hypertension     Respiratory   Chronic sinusitis - Primary   Relevant Medications   loratadine-pseudoephedrine (CLARITIN-D 12-HOUR) 5-120 MG tablet    Other Visit Diagnoses    Vertigo        Stress         Primary hypertension: -BP today is at goal. -Discussed with patient to trial taking HCTZ every other day and continue monitoring BP at home, goal is <130/80. Also discussed external factors that contribute to elevated blood pressure readings including stress and the use of decongestants with pseudoephedrine.  -Follow up in 8 weeks.   Chronic sinusitis, Chronic sinusitis: -Recommend to follow up with ENT. -Will provide refill of Claritin-D, discussed with patient potential side effects including tachycardia and increasing blood pressure so recommend prudent use of medication. -Recommend to trial Nasacort to minimize use of Afrin.   Vertigo: -Stable. Discussed with patient vertigo and tinnitus could possibly be related to Meniere's disease and recommend to further discuss with ENT. Patient not consistently taking HCTZ so difficult to assess efficacy for possible Meniere's diease.  -Continue meclizine as needed for dizziness. Advised to consider PT for vestibular rehabilitation.   Bell's Palsy: -Mild improvement. Recommend to follow up with Neurology. Reassurance provided.  Stress, Anxiety: -Patient failed sertraline due to side effect and prefers to continue with nonpharmacologic therapy. -Recommend to use the Calm app for meditation and mindfulness therapy.  -Continue to work on stress reduction techniques and encourage to consider [facility] therapy.    Meds ordered this encounter  Medications  . loratadine-pseudoephedrine (CLARITIN-D 12-HOUR) 5-120 MG tablet    Sig: Take 1 tablet by mouth 2 (two) times daily.    Dispense:  60 tablet    Refill:  0    Follow-up: Return in about 8 weeks (around 10/09/2020) for Mood, Vertigo, HTN, Tinnitus.   Note:  This note was prepared with assistance of Dragon voice recognition software. Occasional wrong-word or sound-a-like substitutions may have occurred due to the inherent limitations of voice recognition software.  Lorrene Reid,  PA-C

## 2020-08-14 NOTE — Patient Instructions (Signed)
https://www.nimh.nih.gov/health/publications/depression-what-you-need-to-know/index.shtml">  Depression Screening Depression screening is a tool that your health care provider can use to learn if you have symptoms of depression. Depression is a common condition with many symptoms that are also often found in other conditions. Depression is treatable, but it must first be diagnosed. You may not know that certain feelings, thoughts, and behaviors that you are having can be symptoms of depression. Taking a depression screening test can help you and your health care provider decide if you need more assessment, or if you should be referred to a mental health care provider. What are the screening tests?  You may have a physical exam to see if another condition is affecting your mental health. You may have a blood or urine sample taken during the physical exam.  You may be interviewed using a screening tool that was developed from research, such as one of these: ? Patient Health Questionnaire (PHQ). This is a set of either 2 or 9 questions. A health care provider who has been trained to score this screening test uses a guide to assess if your symptoms suggest that you may have depression. ? Hamilton Depression Rating Scale (HAM-D). This is a set of either 17 or 24 questions. You may be asked to take it again during or after your treatment, to see if your depression has gotten better. ? Beck Depression Inventory (BDI). This is a set of 21 multiple choice questions. Your health care provider scores your answers to assess:  Your level of depression, ranging from mild to severe.  Your response to treatment.  Your health care provider may talk with you about your daily activities, such as eating, sleeping, work, and recreation, and ask if you have had any changes in activity.  Your health care provider may ask you to see a mental health specialist, such as a psychiatrist or psychologist, for more  evaluation. Who should be screened for depression?  All adults, including adults with a family history of a mental health disorder.  Adolescents who are 12-18 years old.  People who are recovering from a myocardial infarction (MI).  Pregnant women, or women who have given birth.  People who have a long-term (chronic) illness.  Anyone who has been diagnosed with another type of a mental health disorder.  Anyone who has symptoms that could show depression.   What do my results mean? Your health care provider will review the results of your depression screening, physical exam, and lab tests. Positive screens suggest that you may have depression. Screening is the first step in getting the care that you may need. It is up to you to get your screening results. Ask your health care provider, or the department that is doing your screening tests, when your results will be ready. Talk with your health care provider about your results and diagnosis. A diagnosis of depression is made using the Diagnostic and Statistical Manual of Mental Disorders (DSM-V). This is a book that lists the number and type of symptoms that must be present for a health care provider to give a specific diagnosis.  Your health care provider may work with you to treat your symptoms of depression, or your health care provider may help you find a mental health provider who can assess, diagnose, and treat your depression. Get help right away if:  You have thoughts about hurting yourself or others. If you ever feel like you may hurt yourself or others, or have thoughts about taking your own life, get help   right away. You can go to your nearest emergency department or call:  Your local emergency services (911 in the [place]  A suicide crisis helpline, such as the National Suicide Prevention Lifeline at 1-800-273-8255. This is open 24 hours a day. Summary  Depression screening is the first step in getting the help that you may  need.  If your screening test shows symptoms of depression (is positive), your health care provider may ask you to see a mental health provider.  Anyone who is age 12 or older should be screened for depression. This information is not intended to replace advice given to you by your health care provider. Make sure you discuss any questions you have with your health care provider. Document Revised: 09/14/2019 Document Reviewed: 09/14/2019 Elsevier Patient Education  2021 Elsevier Inc.  

## 2020-09-04 ENCOUNTER — Other Ambulatory Visit: Payer: Self-pay | Admitting: Physician Assistant

## 2020-09-04 DIAGNOSIS — I10 Essential (primary) hypertension: Secondary | ICD-10-CM

## 2020-09-04 DIAGNOSIS — M62838 Other muscle spasm: Secondary | ICD-10-CM

## 2020-09-04 DIAGNOSIS — R42 Dizziness and giddiness: Secondary | ICD-10-CM

## 2020-09-04 DIAGNOSIS — B0229 Other postherpetic nervous system involvement: Secondary | ICD-10-CM

## 2020-09-04 MED ORDER — PREGABALIN 75 MG PO CAPS: 75.0000 mg | ORAL_CAPSULE | Freq: Two times a day (BID) | ORAL | 0 refills | Status: DC

## 2020-09-04 MED ORDER — MECLIZINE HCL 25 MG PO TABS: 25.0000 mg | ORAL_TABLET | Freq: Three times a day (TID) | ORAL | 1 refills | Status: DC | PRN

## 2020-09-04 NOTE — Telephone Encounter (Signed)
Patient last seen 08/14/20 and advised to follow up in 8 weeks.   Last refill given 06/18/20 #180 no refills.

## 2020-09-18 ENCOUNTER — Telehealth: Payer: Self-pay | Admitting: Physician Assistant

## 2020-09-18 ENCOUNTER — Other Ambulatory Visit: Payer: Self-pay

## 2020-09-18 DIAGNOSIS — G510 Bell's palsy: Secondary | ICD-10-CM

## 2020-09-18 NOTE — Telephone Encounter (Signed)
Patient needs a referral to a neurologist different from the last referral. Patient would like it within network of [facility] because of insurance. Symptoms include uncontrolled movements, twitching, moving different ways, possible nerve damage, vertigo, and eye closing. Patient thinks it might be Huntington disease.Patient's vertigo is flaring up a lot. Please advise, thanks.

## 2020-09-18 NOTE — Telephone Encounter (Signed)
Called patient to make him aware that a new neurology referral has been to [facility]. Could not reach patient and unable to leave voicemail. Will try again later.

## 2020-09-18 NOTE — Telephone Encounter (Signed)
Spoke with patient and informed referral has been placed. They will reach out to him to schedule and told him if they have not contacted him in weeks time to let us know so we can follow up.

## 2020-09-26 ENCOUNTER — Other Ambulatory Visit: Payer: Self-pay

## 2020-09-26 ENCOUNTER — Telehealth: Payer: Self-pay | Admitting: Physician Assistant

## 2020-09-26 NOTE — Telephone Encounter (Signed)
Called patient to ask about referral. Unable to reach patient or leave voicemail.

## 2020-09-26 NOTE — Telephone Encounter (Signed)
Called patient and we are going to send a new external referral to [facility] to be seen by endocrinology per patient's request.

## 2020-09-26 NOTE — Telephone Encounter (Signed)
Disregard last note. Patient needs referral to neurology. Both in network options are booked out until September. Patient is frustrated with this and expressed this is unacceptable. Plans on keeping current referral to [facility].

## 2020-09-26 NOTE — Telephone Encounter (Signed)
Patient states a referral placed for Warren Herrera is booked out until September and that will not work for him. He would like a different referral please. Please advise, thanks.

## 2020-09-27 ENCOUNTER — Other Ambulatory Visit: Payer: Self-pay

## 2020-09-27 DIAGNOSIS — G510 Bell's palsy: Secondary | ICD-10-CM

## 2020-10-10 ENCOUNTER — Encounter: Payer: Self-pay | Admitting: Physician Assistant

## 2020-10-10 ENCOUNTER — Telehealth: Payer: Self-pay | Admitting: Neurology

## 2020-10-10 ENCOUNTER — Other Ambulatory Visit: Payer: Self-pay

## 2020-10-10 ENCOUNTER — Ambulatory Visit (INDEPENDENT_AMBULATORY_CARE_PROVIDER_SITE_OTHER): Payer: BC Managed Care – PPO | Admitting: Physician Assistant

## 2020-10-10 VITALS — BP 131/88 | HR 84 | Temp 99.0°F | Ht 74.0 in | Wt 253.8 lb

## 2020-10-10 DIAGNOSIS — G510 Bell's palsy: Secondary | ICD-10-CM | POA: Diagnosis not present

## 2020-10-10 DIAGNOSIS — I10 Essential (primary) hypertension: Secondary | ICD-10-CM | POA: Diagnosis not present

## 2020-10-10 DIAGNOSIS — R42 Dizziness and giddiness: Secondary | ICD-10-CM | POA: Diagnosis not present

## 2020-10-10 DIAGNOSIS — H9319 Tinnitus, unspecified ear: Secondary | ICD-10-CM

## 2020-10-10 NOTE — Telephone Encounter (Signed)
Pt called, would like to speak with a nurse about getting an appt. I know I have a bill to pay before I can be scheduled. Want to know before I pay the bill with Dr. Anne Hahn I can get an appt before September.  Would like a call from the nurse.

## 2020-10-10 NOTE — Progress Notes (Signed)
Established Patient Office Visit  Subjective:  Patient ID: Warren Herrera, male    DOB: 1976-01-06  Age: 45 y.o. MRN: [id number]  CC:  Chief Complaint  Patient presents with   Follow-up    Mood, Vertigo, Tinnitus   Hypertension    HPI Warren Herrera presents for follow up on mood, vertigo and tinnitus. Patient reports vertigo and tinnitus are stable. States has been taking 0.5 tablet of HCTZ daily which seems to keep his blood pressure stable and the tinnitus manageable. Is concerned about taking full dose of HCTZ and worsening his dizziness from med side effect. There are different triggers for his vertigo including head movements and pressure changes. Continues with acupuncture sessions which he feels is helping keep most of his symptoms manageable but continues to be concerned and is upset he has not been able to get established with new neurologist. His left sided facial paralysis has slowly improved. His smile is slightly better and is able to close his eye a little bit more than before. Was concerned about misdiagnosis (Hunt syndrome vs Bell's palsy) and wants a second opinion for reassurance and/or additional evaluation.   Past Medical History:  Diagnosis Date   Bell's palsy    Chronic sinusitis    Left-sided Bell's palsy 04/12/2020   Seasonal allergies    Shingles     Past Surgical History:  Procedure Laterality Date   NASAL SINUS SURGERY      Family History  Problem Relation Age of Onset   Hypertension Mother    Thyroid disease Mother    Glaucoma Father    Cataracts Father     Social History   Socioeconomic History   Marital status: Married    Spouse name: Not on file   Number of children: 4   Years of education: college   Highest education level: Bachelor's degree (e.g., BA, AB, BS)  Occupational History   Occupation: Optician, dispensing  Tobacco Use   Smoking status: Former    Packs/day: 1.00    Years: 4.00    Pack years: 4.00    Types: Cigarettes    Quit date: 2004     Years since quitting: 18.5   Smokeless tobacco: Never  Vaping Use   Vaping Use: Never used  Substance and Sexual Activity   Alcohol use: No   Drug use: No   Sexual activity: Yes    Birth control/protection: None  Other Topics Concern   Not on file  Social History Narrative   Lives with his family.   Right-handed.   Caffeine use: 4 cups per day.   Social Determinants of Health   Financial Resource Strain: Not on file  Food Insecurity: Not on file  Transportation Needs: Not on file  Physical Activity: Not on file  Stress: Not on file  Social Connections: Not on file  Intimate Partner Violence: Not on file    Outpatient Medications Prior to Visit  Medication Sig Dispense Refill   Cholecalciferol (VITAMIN D3) 125 MCG (5000 UT) TABS 5,000 IU OTC vitamin D3 daily. 90 tablet 3   cyclobenzaprine (FLEXERIL) 10 MG tablet TAKE 1 TABLET BY MOUTH AT BEDTIME 90 tablet 0   fluticasone (FLONASE) 50 MCG/ACT nasal spray 1 spray 2 times daily after neil med or AYR sinus rinses 16 g 4   hydrochlorothiazide (HYDRODIURIL) 12.5 MG tablet Take 1 tablet by mouth once daily 30 tablet 0   loratadine-pseudoephedrine (CLARITIN-D 12-HOUR) 5-120 MG tablet Take 1 tablet by mouth 2 (two) times  daily. 60 tablet 0   meclizine (ANTIVERT) 25 MG tablet Take 1 tablet (25 mg total) by mouth 3 (three) times daily as needed for dizziness. 30 tablet 1   multivitamin (ONE-A-DAY MEN'S) TABS tablet Take 1 tablet by mouth daily. 90 tablet 0   pregabalin (LYRICA) 75 MG capsule Take 1 capsule (75 mg total) by mouth 2 (two) times daily. 180 capsule 0   No facility-administered medications prior to visit.    No Known Allergies  ROS Review of Systems A fourteen system review of systems was performed and found to be positive as per HPI.   Objective:    Physical Exam General:  WDWN, in no acute distress  Neuro:  Alert and oriented, extra-ocular muscles intact, left sided facial paralysis with improved smile and eye  closure, slight horizontal nystagmus  HEENT:  Normocephalic, atraumatic, normal TM of both ears, neck supple Skin:  no gross rash, warm, pink. Cardiac:  RRR, S1 S2 Respiratory:  ECTA B/L, Not using accessory muscles, speaking in full sentences- unlabored. Vascular:  Ext warm, no cyanosis apprec.; cap RF less 2 sec. Psych:  No HI/SI, judgement and insight good, Euthymic mood. Full Affect.  BP 131/88   Pulse 84   Temp 99 F (37.2 C)   Ht $R'6\' 2"'sE$  (1.88 m)   Wt 253 lb 12.8 oz (115.1 kg)   SpO2 99%   BMI 32.59 kg/m  Wt Readings from Last 3 Encounters:  10/10/20 253 lb 12.8 oz (115.1 kg)  08/14/20 257 lb 6.4 oz (116.8 kg)  08/06/20 259 lb 12.8 oz (117.8 kg)     Health Maintenance Due  Topic Date Due   COVID-19 Vaccine (1) Never done   TETANUS/TDAP  Never done    There are no preventive care reminders to display for this patient.  Lab Results  Component Value Date   TSH 0.380 (L) 06/20/2020   Lab Results  Component Value Date   WBC 5.9 06/20/2020   HGB 13.6 06/20/2020   HCT 41.9 06/20/2020   MCV 86 06/20/2020   PLT 205 06/20/2020   Lab Results  Component Value Date   NA 143 06/20/2020   K 4.3 06/20/2020   CO2 25 06/20/2020   GLUCOSE 87 06/20/2020   BUN 11 06/20/2020   CREATININE 1.33 (H) 06/20/2020   BILITOT <0.2 06/20/2020   ALKPHOS 61 06/20/2020   AST 24 06/20/2020   ALT 25 06/20/2020   PROT 6.4 06/20/2020   ALBUMIN 4.1 06/20/2020   CALCIUM 9.3 06/20/2020   ANIONGAP 7 03/27/2020   EGFR 68 06/20/2020   Lab Results  Component Value Date   CHOL 134 02/23/2020   Lab Results  Component Value Date   HDL 45 02/23/2020   Lab Results  Component Value Date   LDLCALC 80 02/23/2020   Lab Results  Component Value Date   TRIG 33 02/23/2020   Lab Results  Component Value Date   CHOLHDL 3.0 02/23/2020   Lab Results  Component Value Date   HGBA1C 5.7 (H) 06/20/2020      Assessment & Plan:   Problem List Items Addressed This Visit        Cardiovascular and Mediastinum   Primary hypertension - Primary     Nervous and Auditory   Left-sided Bell's palsy   Other Visit Diagnoses     Vertigo          Primary hypertension: -Stable. -Continue current medication regimen. -Will continue to monitor. Will repeat CMP for medication monitoring at  follow up visit.  Left-sided Bell's palsy: -Patient has gradually improved, recommend to continue with acupuncture therapy. -Will inquire status of new neurology referral.  Vertigo, Tinnitus: -Discussed with patient to follow up with ENT for further evaluation including Meniere's disease.  -Recommend to continue meclizine as needed for dizziness. -Advise to let me know if decides to pursue PT referral for vestibular rehabilitation.    No orders of the defined types were placed in this encounter.   Follow-up: Return in about 3 months (around 01/10/2021) for HTN, mood, vertigo.   Note:  This note was prepared with assistance of Dragon voice recognition software. Occasional wrong-word or sound-a-like substitutions may have occurred due to the inherent limitations of voice recognition software.  Lorrene Reid, PA-C

## 2020-10-14 ENCOUNTER — Ambulatory Visit (INDEPENDENT_AMBULATORY_CARE_PROVIDER_SITE_OTHER): Payer: BC Managed Care – PPO | Admitting: Neurology

## 2020-10-14 ENCOUNTER — Telehealth: Payer: Self-pay | Admitting: Neurology

## 2020-10-14 ENCOUNTER — Encounter: Payer: Self-pay | Admitting: Neurology

## 2020-10-14 ENCOUNTER — Encounter: Payer: Self-pay | Admitting: Adult Health

## 2020-10-14 ENCOUNTER — Telehealth (INDEPENDENT_AMBULATORY_CARE_PROVIDER_SITE_OTHER): Payer: Self-pay | Admitting: Otolaryngology

## 2020-10-14 VITALS — BP 148/90 | HR 90 | Ht 75.0 in | Wt 257.0 lb

## 2020-10-14 DIAGNOSIS — G510 Bell's palsy: Secondary | ICD-10-CM | POA: Diagnosis not present

## 2020-10-14 DIAGNOSIS — H814 Vertigo of central origin: Secondary | ICD-10-CM | POA: Diagnosis not present

## 2020-10-14 DIAGNOSIS — R7303 Prediabetes: Secondary | ICD-10-CM | POA: Diagnosis not present

## 2020-10-14 NOTE — Telephone Encounter (Signed)
Called patient today concerning his "sinus complaints".  We had previously seen him a couple months ago and ordered a CT scan of the sinuses that was denied by his insurance.  He had a previous CT scan of his head performed in December of last year because of a new onset Bell's palsy.  On review of this previous CT scan this showed minimal mucosal thickening within the ethmoid area. He is followed by neurology because of problems with dizziness and questionable Mnire's disease as well as facial nerve weakness following the Bell's palsy.  He is scheduled to get another MRI scan of his head in the next few weeks. Concerning his sinuses his main complaint seems to be trouble breathing through his nose and congestion as well as "poor drainage of his sinuses". He had previously been using decongestant nasal spray on a regular basis and I instructed him to use nasal steroid spray regularly at night and use saline rinse during the day and try to avoid regular use of decongestant spray. He will follow-up in the office following the MRI scan to review the sinus complaints.

## 2020-10-14 NOTE — Progress Notes (Signed)
I have read the note, and I agree with the clinical assessment and plan.  Wilmer Berryhill K Conchetta Lamia   

## 2020-10-14 NOTE — Telephone Encounter (Signed)
I called Warren Herrera at 12:30 PM.  His main concern is that he saw someone for acupuncture.  They mention that he should have been referral to physical therapy for Bell's palsy.  The patient is disgruntled as he feels that Warren Herrera did not offer him any alternative treatment options.  Should be noted that the patient had a 64-month follow-up in April and he missed his appointment.  There are also no other documented calls between that time.  The patient states that his symptoms have gotten worse and he missed his appointment in April because he was "sick."  Again worth noting that there were no documented calls from the patient.  Patient also feels that his bill should be discounted since he was not offered any alternative treatments.  I kindly advised the patient that he was billed for the services provided to him.    The patient is requesting to switch to another neurologist at our office.  I advised him of our office policy.  Since Warren Herrera is his current neurologist he would have to request a switch.  That would mean another neurologist at our office would review his chart and if they agreed to see the patient we could schedule him. However if they did not feel that they had anything new to offer they could decline.  Suggested that the patient may want a second opinion at another neurologist office.  Patient deferred states that he would just prefer to come in to see Warren Herrera again and he would share his concerns with Warren Herrera at that time.  Advised that Warren Herrera is out of the office this week and I will discuss with him Monday morning.

## 2020-10-14 NOTE — Patient Instructions (Signed)
Check MRI of the brain today  Check creatinine today before MRI  Will get you set up to see another neurologist

## 2020-10-14 NOTE — Progress Notes (Signed)
PATIENT: Warren Herrera DOB: [birth year]  REASON FOR VISIT: Follow up HISTORY FROM: Patient Primary Neurologist: Dr. Anne Hahn   Today 10/14/20 Warren Herrera is a 45 year old male with history of left-sided Bell's palsy in December 2021. Claims in feb/march dizziness, ringing in the left ear, saw PCP, felt he developed Menire's disease. Started acupuncture, in April, felt then improvement in left facial droop, can now close left eye. Doing exercises from acupuncture to help move face, this is helpful.  He missed an appointment in April with Dr. Anne Hahn. When he smiles, left eye is starting to close. Claims is only 15-20% back to normal. Has been offered vestibular rehab. Still has constant ringing in the ears. No significant vertigo in a month, triggers by turning his head, triggered by stress. Couldn't hold head up with bad spells, takes meclizine. Occasional mild headache, constant buzzing in left ear since Jan/Feb. Nauseated, sick on stomach with dizziness.  Took leave of absence from work in April, to let things settle down. Saw ENT, insurance didn't approve CT scan for sinuses, Dr. Ezzard Standing back in Feb 2022. Acupuncture helps to settle out dizziness, twice weekly. A1C 5.7 in March. Reports had shingles to left mid flank area back in 2019, but no rash, flares from time to time with pain, on Lyrica, just 75 mg at bedtime. Taste sensation in not completely back, there is some trouble chewing on the left side. Here today for evaluation unaccompanied.  HISTORY  04/12/2020 Dr. Anne Hahn: Warren Herrera is a 45 year old black male with a history of prediabetes.  The patient was seen in the emergency room on 27 March 2020 with a left-sided Bell's palsy.  Two days prior, he began having left ear pain that was relatively severe.  The patient began having some difficulty closing the left eye the night before the ER visit.  The patient also noted some change in his ability to speak, and he had some alteration in taste  sensation and noted that loud noises are more bothersome to him.  He has had a lot of irritation of the left eye with scratchiness.  He was placed on some prednisone and was given Valtrex in the emergency room.  The prednisone did help his ear pain but when the prednisone taper ended, the pain came back.  He has been placed on a 6-day course of 10 mg of prednisone currently which has been helpful.  He also developed some left neck and shoulder discomfort, he was placed on some Lyrica and this has helped.  The patient is trying to patch the eye at night, he reports some blurring of vision during the daytime.  He does have some light sensitivity with.  The patient denies any numbness or weakness of the extremities or difficulty with balance.  He has not had any double vision or difficulty with swallowing.  He has undergone a CT scan of the head in the emergency room that was unremarkable.  He comes to this office for further evaluation.  REVIEW OF SYSTEMS: Out of a complete 14 system review of symptoms, the patient complains only of the following symptoms, and all other reviewed systems are negative.  See HPI  ALLERGIES: No Known Allergies  HOME MEDICATIONS: Outpatient Medications Prior to Visit  Medication Sig Dispense Refill   Cholecalciferol (VITAMIN D3) 125 MCG (5000 UT) TABS 5,000 IU OTC vitamin D3 daily. 90 tablet 3   cyclobenzaprine (FLEXERIL) 10 MG tablet TAKE 1 TABLET BY MOUTH AT BEDTIME 90 tablet 0  fluticasone (FLONASE) 50 MCG/ACT nasal spray 1 spray 2 times daily after neil med or AYR sinus rinses 16 g 4   hydrochlorothiazide (HYDRODIURIL) 12.5 MG tablet Take 1 tablet by mouth once daily 30 tablet 0   loratadine-pseudoephedrine (CLARITIN-D 12-HOUR) 5-120 MG tablet Take 1 tablet by mouth 2 (two) times daily. 60 tablet 0   meclizine (ANTIVERT) 25 MG tablet Take 1 tablet (25 mg total) by mouth 3 (three) times daily as needed for dizziness. 30 tablet 1   multivitamin (ONE-A-DAY MEN'S) TABS  tablet Take 1 tablet by mouth daily. 90 tablet 0   pregabalin (LYRICA) 75 MG capsule Take 1 capsule (75 mg total) by mouth 2 (two) times daily. 180 capsule 0   No facility-administered medications prior to visit.    PAST MEDICAL HISTORY: Past Medical History:  Diagnosis Date   Bell's palsy    Chronic sinusitis    Left-sided Bell's palsy 04/12/2020   Seasonal allergies    Shingles     PAST SURGICAL HISTORY: Past Surgical History:  Procedure Laterality Date   NASAL SINUS SURGERY      FAMILY HISTORY: Family History  Problem Relation Age of Onset   Hypertension Mother    Thyroid disease Mother    Glaucoma Father    Cataracts Father     SOCIAL HISTORY: Social History   Socioeconomic History   Marital status: Married    Spouse name: Not on file   Number of children: 4   Years of education: college   Highest education level: Bachelor's degree (e.g., BA, AB, BS)  Occupational History   Occupation: Optician, dispensing  Tobacco Use   Smoking status: Former    Packs/day: 1.00    Years: 4.00    Pack years: 4.00    Types: Cigarettes    Quit date: 2004    Years since quitting: 18.5   Smokeless tobacco: Never  Vaping Use   Vaping Use: Never used  Substance and Sexual Activity   Alcohol use: No   Drug use: No   Sexual activity: Yes    Birth control/protection: None  Other Topics Concern   Not on file  Social History Narrative   Lives with his family.   Right-handed.   Caffeine use: 4 cups per day.   Social Determinants of Health   Financial Resource Strain: Not on file  Food Insecurity: Not on file  Transportation Needs: Not on file  Physical Activity: Not on file  Stress: Not on file  Social Connections: Not on file  Intimate Partner Violence: Not on file   PHYSICAL EXAM  Vitals:   10/14/20 0834  BP: (!) 148/90  Pulse: 90  Weight: 257 lb (116.6 kg)  Height: 6\' 3"  (1.905 m)   Body mass index is 32.12 kg/m. Generalized: Well developed, in no acute distress   Neurological examination  Mentation: Alert oriented to time, place, history taking. Follows all commands speech and language fluent Cranial nerve II-XII: Pupils were equal round reactive to light. Extraocular movements were full, visual field were full on confrontational test. Can close left eye completely, but cannot blink fully. With smiling left asymmetry at mouth, noted smile triggers closing left eye, mild weakness of left eye open, there is cheek puff weakness on left side. head turning and shoulder shrug  were normal and symmetric. Motor: The motor testing reveals 5 over 5 strength of all 4 extremities. Good symmetric motor tone is noted throughout.  Sensory: Sensory testing is intact to soft touch on all 4  extremities. No evidence of extinction is noted.  Coordination: Cerebellar testing reveals good finger-nose-finger and heel-to-shin bilaterally.  Gait and station: Gait is normal. Tandem gait is normal.  Reflexes: Deep tendon reflexes are symmetric and normal bilaterally.   DIAGNOSTIC DATA (LABS, IMAGING, TESTING) - I reviewed patient records, labs, notes, testing and imaging myself where available.  Lab Results  Component Value Date   WBC 5.9 06/20/2020   HGB 13.6 06/20/2020   HCT 41.9 06/20/2020   MCV 86 06/20/2020   PLT 205 06/20/2020      Component Value Date/Time   NA 143 06/20/2020 0842   K 4.3 06/20/2020 0842   CL 105 06/20/2020 0842   CO2 25 06/20/2020 0842   GLUCOSE 87 06/20/2020 0842   GLUCOSE 123 (H) 03/27/2020 0838   BUN 11 06/20/2020 0842   CREATININE 1.33 (H) 06/20/2020 0842   CALCIUM 9.3 06/20/2020 0842   PROT 6.4 06/20/2020 0842   ALBUMIN 4.1 06/20/2020 0842   AST 24 06/20/2020 0842   ALT 25 06/20/2020 0842   ALKPHOS 61 06/20/2020 0842   BILITOT <0.2 06/20/2020 0842   GFRNONAA >60 03/27/2020 0838   GFRAA 97 02/23/2020 0933   Lab Results  Component Value Date   CHOL 134 02/23/2020   HDL 45 02/23/2020   LDLCALC 80 02/23/2020   TRIG 33 02/23/2020    CHOLHDL 3.0 02/23/2020   Lab Results  Component Value Date   HGBA1C 5.7 (H) 06/20/2020   Lab Results  Component Value Date   VITAMINB12 1,933 (H) 05/19/2018   Lab Results  Component Value Date   TSH 0.380 (L) 06/20/2020   ASSESSMENT AND PLAN 45 y.o. year old male  has a past medical history of Bell's palsy, Chronic sinusitis, Left-sided Bell's palsy (04/12/2020), Seasonal allergies, and Shingles. here with:  1.  Left-sided Bell's palsy 2.  Prediabetes  -Will order MRI of the brain with and without contrast, has been about 7 months since initial onset of symptoms, indicates only about 15 to 20% improvement, very concerning to him (reviewed best MRI with Dr. Epimenio Foot, will get MRI brain with IAC)  -Since the initial onset of Bell's palsy in December 2021, has developed other concerning symptoms such as dizziness, constant ringing to the left ear, hence will check MRI of the brain, has seen ENT, CT sinuses ordered, insurance denied  -He missed his follow-up appointment with Dr. Anne Hahn in April, wishes to have another neurology opinion, but does not wish to go out of the practice, will discuss with office manager and find best way to help, get another physician opinion, he has concerns about Ramsay Hunt syndrome  -There are signs of aberrant regeneration on exam, he feels he is benefiting from acupuncture, which he can continue  -Reviewed lab work from March 2022, Lyme was negative, CBC was unremarkable, creatinine 1.33, A1c 5.7, TSH 0.380, free T4 was normal, varicella 2,358 (immune); A1c is slightly improved but is borderline.  Orders Placed This Encounter  Procedures   Warren BRAIN/IAC W WO CONTRAST   Creatinine, Serum    I spent 35 minutes of face-to-face and non-face-to-face time with patient.  This included previsit chart review, lab review, study review, discussing his history, concerns, medications, management and follow-up.  Margie Ege, AGNP-C, DNP 10/14/2020, 8:42 AM [facility] [address] [place], [place] [address] [phone]

## 2020-10-14 NOTE — Telephone Encounter (Signed)
Please call patient, he has some questions and concerns per our conversation.

## 2020-10-15 ENCOUNTER — Telehealth: Payer: Self-pay | Admitting: *Deleted

## 2020-10-15 LAB — CREATININE, SERUM
Creatinine, Ser: 1.14 mg/dL (ref 0.76–1.27)
eGFR: 82 mL/min/{1.73_m2} (ref >59)

## 2020-10-15 NOTE — Telephone Encounter (Signed)
-----   Message from Glean Salvo, NP sent at 10/15/2020  6:00 AM EDT ----- Creatinine okay for MRI with contrast, please drink plenty of water before and after MRI.

## 2020-10-15 NOTE — Telephone Encounter (Signed)
I spoke to the patient and provided him with the results. He understands to increase his water intake.

## 2020-10-17 ENCOUNTER — Telehealth: Payer: Self-pay | Admitting: Neurology

## 2020-10-17 NOTE — Telephone Encounter (Signed)
MRI brain w/wo contrast approved by [employer]. PA #[id number] (10/17/20- 11/15/20). Sent order to GI. They will call patient to schedule.

## 2020-10-22 NOTE — Telephone Encounter (Signed)
Called patient and informed him that I want to schedule a FU with Dr Anne Hahn following his MRI on 11/02/20. Scheduled for soonest available and put him on wait list. Patient verbalized understanding, appreciation.

## 2020-10-22 NOTE — Telephone Encounter (Signed)
Please call and work in with Dr. Anne Hahn after MRI.

## 2020-11-02 ENCOUNTER — Other Ambulatory Visit: Payer: BC Managed Care – PPO

## 2020-11-04 ENCOUNTER — Other Ambulatory Visit: Payer: Self-pay

## 2020-11-04 ENCOUNTER — Ambulatory Visit
Admission: RE | Admit: 2020-11-04 | Discharge: 2020-11-04 | Disposition: A | Discharge status: Home / Self Care | Payer: BC Managed Care – PPO | Source: Ambulatory Visit | Attending: Neurology | Admitting: Neurology

## 2020-11-04 DIAGNOSIS — H814 Vertigo of central origin: Secondary | ICD-10-CM

## 2020-11-04 MED ORDER — GADOBENATE DIMEGLUMINE 529 MG/ML IV SOLN
20.0000 mL | Freq: Once | INTRAVENOUS | Status: AC | PRN
  Administered 2020-11-04: 20 mL via INTRAVENOUS

## 2020-11-05 ENCOUNTER — Telehealth: Payer: Self-pay | Admitting: *Deleted

## 2020-11-05 NOTE — Telephone Encounter (Signed)
I spoke to the patient. He verbalized understanding of the findings. He will keep his current pending appt with Dr. Anne Hahn.

## 2020-11-05 NOTE — Telephone Encounter (Signed)
-----   Message from Suanne Marker, MD sent at 11/05/2020 10:28 AM EDT ----- Unremarkable imaging results. Please call patient. Continue current plan. -VRP

## 2020-11-15 ENCOUNTER — Other Ambulatory Visit: Payer: Self-pay | Admitting: Physician Assistant

## 2020-11-15 DIAGNOSIS — R42 Dizziness and giddiness: Secondary | ICD-10-CM

## 2020-11-15 MED ORDER — MECLIZINE HCL 25 MG PO TABS: 25.0000 mg | ORAL_TABLET | Freq: Three times a day (TID) | ORAL | 1 refills | Status: DC | PRN

## 2020-11-20 ENCOUNTER — Encounter: Payer: Self-pay | Admitting: Neurology

## 2020-11-20 ENCOUNTER — Ambulatory Visit (INDEPENDENT_AMBULATORY_CARE_PROVIDER_SITE_OTHER): Payer: BC Managed Care – PPO | Admitting: Neurology

## 2020-11-20 VITALS — BP 145/101 | HR 78 | Ht 75.0 in | Wt 251.0 lb

## 2020-11-20 DIAGNOSIS — R6889 Other general symptoms and signs: Secondary | ICD-10-CM | POA: Diagnosis not present

## 2020-11-20 DIAGNOSIS — R42 Dizziness and giddiness: Secondary | ICD-10-CM

## 2020-11-20 DIAGNOSIS — G510 Bell's palsy: Secondary | ICD-10-CM | POA: Diagnosis not present

## 2020-11-20 DIAGNOSIS — Z0189 Encounter for other specified special examinations: Secondary | ICD-10-CM | POA: Diagnosis not present

## 2020-11-20 HISTORY — DX: Dizziness and giddiness: R42

## 2020-11-20 MED ORDER — PREGABALIN 25 MG PO CAPS: ORAL_CAPSULE | ORAL | 3 refills | Status: DC

## 2020-11-20 NOTE — Progress Notes (Signed)
Reason for visit: Left Bell's palsy, onset of vertigo  Warren Herrera is an 45 y.o. male  History of present illness:  Mr. Warren Herrera is a 45 year old right-handed black male with a history of prediabetes.  The patient has been treated for onset of left shoulder and neck discomfort for least 8 or 9 months, he has been on Lyrica for this.  He developed a left-sided Bell's palsy in December 2021.  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  In March or April 2022 he developed onset of vertigo with ringing in the ear on the left that has been persistent.  He will have episodes of increased vertigo, but he does not clearly have any change in hearing with these events.  He has been seen by Dr. Ezzard Standing from ENT, the possibility of Mnire's disease has been entertained.  He has not yet had audiometric testing.  The patient indicates that he did have some headaches at the onset of the vertigo, but this has resolved.  MRI of the brain done with and without contrast did not show any significant abnormalities.  The patient is still working out some, he is able to drive a car, he is not having any falls.  He denies any weakness or numbness of the face, arms or legs.  He has not had any change in speech or swallowing.  He reports some occasional transient double vision.  The patient comes to this office for further evaluation.  The patient does have hypertension, he is on hydrochlorothiazide, he has been under increased stress associated with a divorce.  He denies any particular head position that brings on the dizziness, he just has good days and bad days with the symptoms.  Past Medical History:  Diagnosis Date   Bell's palsy    Chronic sinusitis    Left-sided Bell's palsy 04/12/2020   Seasonal allergies    Shingles     Past Surgical History:  Procedure Laterality Date   NASAL SINUS SURGERY      Family History  Problem Relation Age of  Onset   Hypertension Mother    Thyroid disease Mother    Glaucoma Father    Cataracts Father     Social history:  reports that he quit smoking about 18 years ago. His smoking use included cigarettes. He has a 4.00 pack-year smoking history. He has never used smokeless tobacco. He reports that he does not drink alcohol and does not use drugs.   No Known Allergies  Medications:  Prior to Admission medications   Medication Sig Start Date End Date Taking? Authorizing Provider  Cholecalciferol (VITAMIN D3) 125 MCG (5000 UT) TABS 5,000 IU OTC vitamin D3 daily. 05/19/18  Yes Opalski, Gavin Pound, DO  cyclobenzaprine (FLEXERIL) 10 MG tablet TAKE 1 TABLET BY MOUTH AT BEDTIME 09/04/20  Yes Abonza, Maritza, PA-C  fluticasone (FLONASE) 50 MCG/ACT nasal spray 1 spray 2 times daily after neil med or AYR sinus rinses 05/19/18  Yes Opalski, Deborah, DO  hydrochlorothiazide (HYDRODIURIL) 12.5 MG tablet Take 1 tablet by mouth once daily 09/04/20  Yes Abonza, Maritza, PA-C  loratadine-pseudoephedrine (CLARITIN-D 12-HOUR) 5-120 MG tablet Take 1 tablet by mouth 2 (two) times daily. 08/14/20  Yes Abonza, Maritza, PA-C  meclizine (ANTIVERT) 25 MG tablet Take 1 tablet (25 mg total) by mouth 3 (three) times daily as needed for dizziness. 11/15/20  Yes Abonza, Kandis Cocking, PA-C  multivitamin (ONE-A-DAY MEN'S) TABS tablet Take 1 tablet by  mouth daily. 05/24/18  Yes Opalski, Gavin Pound, DO  pregabalin (LYRICA) 75 MG capsule Take 1 capsule (75 mg total) by mouth 2 (two) times daily. 09/04/20  Yes Abonza, Maritza, PA-C    ROS:  Out of a complete 14 system review of symptoms, the patient complains only of the following symptoms, and all other reviewed systems are negative.  Vertigo Left facial weakness  Blood pressure (!) 145/101, pulse 78, height 6\' 3"  (1.905 m), weight 251 lb (113.9 kg).  Physical Exam  General: The patient is alert and cooperative at the time of the examination.  Ears: Tympanic membranes are clear  bilaterally.  Eyes: Pupils are equal, round, and reactive to light.  Discs are flat bilaterally.  Extraocular movements are full.  Visual fields are full.  Skin: No significant peripheral edema is noted.   Neurologic Exam  Mental status: The patient is alert and oriented x 3 at the time of the examination. The patient has apparent normal recent and remote memory, with an apparently normal attention span and concentration ability.   Cranial nerves: Facial symmetry is not present.  The patient has significant weakness with elevation of the brow on the left, with puckering of the lips, he does have some partial closure of the left eye.  Speech is normal, no aphasia or dysarthria is noted.  Motor: The patient has good strength in all 4 extremities.  Sensory examination: Soft touch sensation is symmetric on the face, arms, and legs.  Vibration sensation is symmetric on the arms and legs.  Coordination: The patient has good finger-nose-finger and heel-to-shin bilaterally.  Gait and station: The patient has a normal gait. Tandem gait is normal. Romberg is negative. No drift is seen.  Reflexes: Deep tendon reflexes are symmetric.   MRI brain 11/04/20:  IMPRESSION: This MRI of the brain with and without contrast with added views to the internal auditory canal shows the following: 1.  The VIIIth nerves and internal auditory canals appear normal before and after contrast. 2.   Brain parenchyma is normal for age with just 1 punctate T2/FLAIR hyperintense focus in the deep white matter of the left parietal lobe, unlikely to be significant. 3.   Minimal mucoperiosteal thickening in some of the paranasal sinuses. 4.   No acute findings.  Normal enhancement pattern.  * MRI scan images were reviewed online. I agree with the written report.   Assessment/Plan:  1.  Left Bell's palsy  2.  Left shoulder and left side discomfort  3.  Vertigo, left ear tinnitus  The patient has had multiple  symptoms on the left side.  He does have a history of borderline diabetes.  The patient was sent for blood work looking for other sources of vasculitis that could explain multiple cranial nerve issues.  MRI of the brain did not show any structural abnormalities that would explain his symptoms.  The patient does not report any positional symptoms with vertigo.  Audiometric testing may be of some help with the diagnosis, he will follow-up with the ENT physician.  He will follow-up here in 4 months, in the future, he may be seen through Dr. 01/04/21.  Epimenio Foot MD 11/20/2020 7:26 AM  [facility] [address] [place], [place]  Phone [phone] Fax [phone]

## 2020-11-21 LAB — HIV ANTIBODY (ROUTINE TESTING W REFLEX): HIV Screen 4th Generation wRfx: NONREACTIVE

## 2020-11-21 LAB — RPR: RPR Ser Ql: NONREACTIVE

## 2020-11-21 LAB — LYME DISEASE SEROLOGY W/REFLEX: Lyme Total Antibody EIA: NEGATIVE

## 2020-11-21 LAB — SEDIMENTATION RATE: Sed Rate: 7 mm/hr (ref 0–15)

## 2020-11-21 LAB — ANGIOTENSIN CONVERTING ENZYME: Angio Convert Enzyme: 40 U/L (ref 14–82)

## 2020-11-21 LAB — ANA W/REFLEX: Anti Nuclear Antibody (ANA): NEGATIVE

## 2020-11-21 LAB — HEPATITIS C ANTIBODY: Hep C Virus Ab: 0.1 s/co ratio (ref 0.0–0.9)

## 2020-11-22 ENCOUNTER — Telehealth: Payer: Self-pay

## 2020-11-22 NOTE — Telephone Encounter (Signed)
I called the pt and advised of lab, she verbalized understanding and had no other questions/concerns.

## 2020-11-22 NOTE — Telephone Encounter (Signed)
-----   Message from York Spaniel, MD sent at 11/21/2020  5:16 PM EDT ----- Blood work is unremarkable, no source of vasculitis was noted.  Please call the patient. ----- Message ----- From: Nell Range Lab Results In Sent: 11/21/2020   7:37 AM EDT To: York Spaniel, MD

## 2020-12-21 ENCOUNTER — Other Ambulatory Visit: Payer: Self-pay | Admitting: Physician Assistant

## 2020-12-21 DIAGNOSIS — I10 Essential (primary) hypertension: Secondary | ICD-10-CM

## 2021-01-09 ENCOUNTER — Other Ambulatory Visit: Payer: Self-pay | Admitting: Physician Assistant

## 2021-01-09 DIAGNOSIS — R42 Dizziness and giddiness: Secondary | ICD-10-CM

## 2021-01-16 ENCOUNTER — Other Ambulatory Visit: Payer: Self-pay

## 2021-01-16 ENCOUNTER — Ambulatory Visit: Payer: BC Managed Care – PPO | Admitting: Physician Assistant

## 2021-01-16 ENCOUNTER — Encounter: Payer: Self-pay | Admitting: Physician Assistant

## 2021-01-16 VITALS — BP 135/85 | HR 82 | Temp 98.2°F | Ht 75.0 in | Wt 249.4 lb

## 2021-01-16 DIAGNOSIS — G510 Bell's palsy: Secondary | ICD-10-CM

## 2021-01-16 DIAGNOSIS — F4323 Adjustment disorder with mixed anxiety and depressed mood: Secondary | ICD-10-CM

## 2021-01-16 DIAGNOSIS — I10 Essential (primary) hypertension: Secondary | ICD-10-CM | POA: Diagnosis not present

## 2021-01-16 DIAGNOSIS — R42 Dizziness and giddiness: Secondary | ICD-10-CM | POA: Diagnosis not present

## 2021-01-16 MED ORDER — MECLIZINE HCL 25 MG PO TABS: ORAL_TABLET | ORAL | 1 refills | Status: DC

## 2021-01-16 MED ORDER — HYDROCHLOROTHIAZIDE 12.5 MG PO TABS: 12.5000 mg | ORAL_TABLET | Freq: Every day | ORAL | 1 refills | Status: DC

## 2021-01-16 NOTE — Progress Notes (Signed)
Established Patient Office Visit  Subjective:  Patient ID: Warren Herrera, male    DOB: 16-Mar-1976  Age: 45 y.o. MRN: [id number]  CC:  Chief Complaint  Patient presents with   Follow-up    Mood   Hypertension   Dizziness    HPI [name] presents for follow up on hypertension, mood, and vertigo. Patient reports continues to be anxious and working on artistry helps with relaxation. Continues to work on reducing stress. Tries to have a weekend get-away once a month. Patient recently saw neurologist regarding Bell's Palsy and states nothing new. Continues with acupuncture sessions which continues to help including when he has a severe vertigo flare-up.  HTN: Pt denies chest pain, palpitations, dizziness or lower extremity swelling. Taking medication 0.5 tablet of HCTZ daily. Checks BP at home and reports readings fluctuate form 120-140s/70-80s.  Vertigo: Has intermittent flare-ups. States tinnitus is tolerable. Reports taking meclizine more frequently than before.   Past Medical History:  Diagnosis Date   Bell's palsy    Chronic sinusitis    Left-sided Bell's palsy 04/12/2020   Seasonal allergies    Shingles    Vertigo 11/20/2020    Past Surgical History:  Procedure Laterality Date   NASAL SINUS SURGERY      Family History  Problem Relation Age of Onset   Hypertension Mother    Thyroid disease Mother    Glaucoma Father    Cataracts Father     Social History   Socioeconomic History   Marital status: Married    Spouse name: Not on file   Number of children: 4   Years of education: college   Highest education level: Bachelor's degree (e.g., BA, AB, BS)  Occupational History   Occupation: Optician, dispensing  Tobacco Use   Smoking status: Former    Packs/day: 1.00    Years: 4.00    Pack years: 4.00    Types: Cigarettes    Quit date: 2004    Years since quitting: 18.7   Smokeless tobacco: Never  Vaping Use   Vaping Use: Never used  Substance and Sexual Activity    Alcohol use: No   Drug use: No   Sexual activity: Yes    Birth control/protection: None  Other Topics Concern   Not on file  Social History Narrative   Lives with his family.   Right-handed.   Caffeine use: 4 cups per day.   Social Determinants of Health   Financial Resource Strain: Not on file  Food Insecurity: Not on file  Transportation Needs: Not on file  Physical Activity: Not on file  Stress: Not on file  Social Connections: Not on file  Intimate Partner Violence: Not on file    Outpatient Medications Prior to Visit  Medication Sig Dispense Refill   Cholecalciferol (VITAMIN D3) 125 MCG (5000 UT) TABS 5,000 IU OTC vitamin D3 daily. 90 tablet 3   cyclobenzaprine (FLEXERIL) 10 MG tablet TAKE 1 TABLET BY MOUTH AT BEDTIME 90 tablet 0   fluticasone (FLONASE) 50 MCG/ACT nasal spray 1 spray 2 times daily after neil med or AYR sinus rinses 16 g 4   loratadine-pseudoephedrine (CLARITIN-D 12-HOUR) 5-120 MG tablet Take 1 tablet by mouth 2 (two) times daily. 60 tablet 0   multivitamin (ONE-A-DAY MEN'S) TABS tablet Take 1 tablet by mouth daily. 90 tablet 0   pregabalin (LYRICA) 25 MG capsule 2 capsules at night 60 capsule 3   hydrochlorothiazide (HYDRODIURIL) 12.5 MG tablet Take 1 tablet by mouth once daily  90 tablet 0   meclizine (ANTIVERT) 25 MG tablet TAKE 1 TABLET BY MOUTH THREE TIMES DAILY AS NEEDED FOR DIZZINESS 30 tablet 0   No facility-administered medications prior to visit.    No Known Allergies  ROS Review of Systems Review of Systems:  A fourteen system review of systems was performed and found to be positive as per HPI.   Objective:    Physical Exam General:  Well Developed, well nourished, appropriate for stated age.  Neuro:  Alert and oriented,  extra-ocular muscles intact  HEENT:  Normocephalic, atraumatic, neck supple Skin:  no gross rash, warm, pink. Cardiac:  RRR, S1 S2 Respiratory: CTA B/L, Not using accessory muscles, speaking in full sentences-  unlabored. Vascular:  Ext warm, no cyanosis apprec.; cap RF less 2 sec. Psych:  No HI/SI, judgement and insight good, Euthymic mood. Full Affect.  BP 135/85   Pulse 82   Temp 98.2 F (36.8 C)   Ht 6\' 3"  (1.905 m)   Wt 249 lb 6.4 oz (113.1 kg)   SpO2 100%   BMI 31.17 kg/m  Wt Readings from Last 3 Encounters:  01/16/21 249 lb 6.4 oz (113.1 kg)  11/20/20 251 lb (113.9 kg)  10/14/20 257 lb (116.6 kg)     Health Maintenance Due  Topic Date Due   COVID-19 Vaccine (1) Never done   TETANUS/TDAP  Never done   INFLUENZA VACCINE  11/04/2020    There are no preventive care reminders to display for this patient.  Lab Results  Component Value Date   TSH 0.380 (L) 06/20/2020   Lab Results  Component Value Date   WBC 5.9 06/20/2020   HGB 13.6 06/20/2020   HCT 41.9 06/20/2020   MCV 86 06/20/2020   PLT 205 06/20/2020   Lab Results  Component Value Date   NA 143 06/20/2020   K 4.3 06/20/2020   CO2 25 06/20/2020   GLUCOSE 87 06/20/2020   BUN 11 06/20/2020   CREATININE 1.14 10/14/2020   BILITOT <0.2 06/20/2020   ALKPHOS 61 06/20/2020   AST 24 06/20/2020   ALT 25 06/20/2020   PROT 6.4 06/20/2020   ALBUMIN 4.1 06/20/2020   CALCIUM 9.3 06/20/2020   ANIONGAP 7 03/27/2020   EGFR 82 10/14/2020   Lab Results  Component Value Date   CHOL 134 02/23/2020   Lab Results  Component Value Date   HDL 45 02/23/2020   Lab Results  Component Value Date   LDLCALC 80 02/23/2020   Lab Results  Component Value Date   TRIG 33 02/23/2020   Lab Results  Component Value Date   CHOLHDL 3.0 02/23/2020   Lab Results  Component Value Date   HGBA1C 5.7 (H) 06/20/2020   Depression screen PHQ 2/9 01/16/2021 10/10/2020 08/14/2020 08/06/2020 07/03/2020  Decreased Interest 1 1 1 1 1   Down, Depressed, Hopeless 1 1 1 1 1   PHQ - 2 Score 2 2 2 2 2   Altered sleeping 1 1 1 1 1   Tired, decreased energy 1 1 1 1 1   Change in appetite 1 1 1 1 1   Feeling bad or failure about yourself  1 1 1 1 1    Trouble concentrating 1 1 1 1 1   Moving slowly or fidgety/restless 0 1 1 0 1  Suicidal thoughts 0 0 0 0 0  PHQ-9 Score 7 8 8 7 8   Difficult doing work/chores - - Very difficult - -  Some recent data might be hidden   GAD 7 :  Generalized Anxiety Score 01/16/2021 10/10/2020 11/17/2019  Nervous, Anxious, on Edge 1 2 2   Control/stop worrying 1 1 1   Worry too much - different things 1 2 3   Trouble relaxing 1 1 1   Restless 1 1 0  Easily annoyed or irritable 1 1 1   Afraid - awful might happen 1 1 1   Total GAD 7 Score 7 9 9   Anxiety Difficulty Somewhat difficult - Very difficult       Assessment & Plan:   Problem List Items Addressed This Visit       Cardiovascular and Mediastinum   Primary hypertension - Primary   Relevant Medications   hydrochlorothiazide (HYDRODIURIL) 12.5 MG tablet     Nervous and Auditory   Left-sided Bell's palsy     Other   Adjustment disorder with mixed anxiety and depressed mood   Vertigo   Relevant Medications   meclizine (ANTIVERT) 25 MG tablet   Other Relevant Orders   Ambulatory referral to Physical Therapy   Hypertension: -BP in office stable. -Continue current medication regimen. Provided refill. -Will continue to monitor.  Vertigo: -Continue meclizine as needed for dizziness. -Will place referral to PT for vestibular rehabilitation. -Patient will notify the clinic of preference for ENT referral.  Adjustment disorder with mixed anxiety and depressed mood: -Stable. -Recommend to continue non-pharmacologic therapy and stress reduction techniques. -Will continue to monitor.  Left-sided Bell's Palsy: -Chronic, severe case. -Patient will notify the clinic if decides to pursue neurology consult for second opinion. Acupuncture therapy has been beneficial so recommend to continue. -Reviewed neurology consult.  -MRI Brain 11/04/20: IMPRESSION: This MRI of the brain with and without contrast with added views to the internal auditory canal  shows the following: 1.  The VIIIth nerves and internal auditory canals appear normal before and after contrast. 2.   Brain parenchyma is normal for age with just 1 punctate T2/FLAIR hyperintense focus in the deep white matter of the left parietal lobe, unlikely to be significant. 3.   Minimal mucoperiosteal thickening in some of the paranasal sinuses. 4.   No acute findings.  Normal enhancement pattern.  Meds ordered this encounter  Medications   meclizine (ANTIVERT) 25 MG tablet    Sig: TAKE 1 TABLET BY MOUTH THREE TIMES DAILY AS NEEDED FOR DIZZINESS    Dispense:  270 tablet    Refill:  1   hydrochlorothiazide (HYDRODIURIL) 12.5 MG tablet    Sig: Take 1 tablet (12.5 mg total) by mouth daily.    Dispense:  90 tablet    Refill:  1    Follow-up: Return in about 3 months (around 04/18/2021) for CPE and FBW .    Lorrene Reid, PA-C

## 2021-01-21 ENCOUNTER — Telehealth: Payer: Self-pay

## 2021-01-21 NOTE — Telephone Encounter (Signed)
Called patient to notify referral to physical therapy is not necessary to be seen by [facility]. Gave Warren Herrera the contact information to schedule his appointment.

## 2021-03-26 ENCOUNTER — Encounter: Payer: Self-pay | Admitting: Adult Health

## 2021-03-26 ENCOUNTER — Telehealth: Payer: Self-pay

## 2021-03-26 ENCOUNTER — Ambulatory Visit: Payer: 59 | Admitting: Adult Health

## 2021-03-26 VITALS — BP 143/96 | HR 86 | Ht 75.0 in | Wt 266.6 lb

## 2021-03-26 DIAGNOSIS — R42 Dizziness and giddiness: Secondary | ICD-10-CM | POA: Diagnosis not present

## 2021-03-26 DIAGNOSIS — G510 Bell's palsy: Secondary | ICD-10-CM | POA: Diagnosis not present

## 2021-03-26 NOTE — Progress Notes (Signed)
PATIENT: Warren Herrera DOB: [birth year]  REASON FOR VISIT: follow up HISTORY FROM: patient PRIMARY NEUROLOGIST:   HISTORY OF PRESENT ILLNESS: Today 03/26/21:  Warren Herrera is a 45 year old male with a history of vertigo and Bell's palsy.Marland Kitchen  He returns today for follow-up.  The patient was being seen by ENT and was post to have audiometric testing but he reports that his ENT retired.  He is still having daily episodes of dizziness.  Does not typically occur with position changes.  He states that it feels like a "woozy" feeling.  He does have nausea.  He returns today for an evaluation.  HISTORY (copied from Dr. Clarisa Kindred note) Warren Herrera is a 45 year old right-handed black male with a history of prediabetes.  The patient has been treated for onset of left shoulder and neck discomfort for least 8 or 9 months, he has been on Lyrica for this.  He developed a left-sided Bell's palsy in December 2021.  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  In March or April 2022 he developed onset of vertigo with ringing in the ear on the left that has been persistent.  He will have episodes of increased vertigo, but he does not clearly have any change in hearing with these events.  He has been seen by Dr. Ezzard Standing from ENT, the possibility of Mnire's disease has been entertained.  He has not yet had audiometric testing.  The patient indicates that he did have some headaches at the onset of the vertigo, but this has resolved.  MRI of the brain done with and without contrast did not show any significant abnormalities.  The patient is still working out some, he is able to drive a car, he is not having any falls.  He denies any weakness or numbness of the face, arms or legs.  He has not had any change in speech or swallowing.  He reports some occasional transient double vision.  The patient comes to this office for further evaluation.  The patient does have  hypertension, he is on hydrochlorothiazide, he has been under increased stress associated with a divorce.  He denies any particular head position that brings on the dizziness, he just has good days and bad days with the symptoms.    REVIEW OF SYSTEMS: Out of a complete 14 system review of symptoms, the patient complains only of the following symptoms, and all other reviewed systems are negative.  See HPI  ALLERGIES: No Known Allergies  HOME MEDICATIONS: Outpatient Medications Prior to Visit  Medication Sig Dispense Refill   Cholecalciferol (VITAMIN D3) 125 MCG (5000 UT) TABS 5,000 IU OTC vitamin D3 daily. 90 tablet 3   cyclobenzaprine (FLEXERIL) 10 MG tablet TAKE 1 TABLET BY MOUTH AT BEDTIME 90 tablet 0   fluticasone (FLONASE) 50 MCG/ACT nasal spray 1 spray 2 times daily after neil med or AYR sinus rinses 16 g 4   hydrochlorothiazide (HYDRODIURIL) 12.5 MG tablet Take 1 tablet (12.5 mg total) by mouth daily. 90 tablet 1   loratadine-pseudoephedrine (CLARITIN-D 12-HOUR) 5-120 MG tablet Take 1 tablet by mouth 2 (two) times daily. 60 tablet 0   meclizine (ANTIVERT) 25 MG tablet TAKE 1 TABLET BY MOUTH THREE TIMES DAILY AS NEEDED FOR DIZZINESS 270 tablet 1   multivitamin (ONE-A-DAY MEN'S) TABS tablet Take 1 tablet by mouth daily. 90 tablet 0   pregabalin (LYRICA) 25 MG capsule 2 capsules at night 60 capsule 3   No  facility-administered medications prior to visit.    PAST MEDICAL HISTORY: Past Medical History:  Diagnosis Date   Bell's palsy    Chronic sinusitis    Left-sided Bell's palsy 04/12/2020   Seasonal allergies    Shingles    Vertigo 11/20/2020    PAST SURGICAL HISTORY: Past Surgical History:  Procedure Laterality Date   NASAL SINUS SURGERY      FAMILY HISTORY: Family History  Problem Relation Age of Onset   Hypertension Mother    Thyroid disease Mother    Glaucoma Father    Cataracts Father     SOCIAL HISTORY: Social History   Socioeconomic History   Marital  status: Married    Spouse name: Not on file   Number of children: 4   Years of education: college   Highest education level: Bachelor's degree (e.g., BA, AB, BS)  Occupational History   Occupation: Optician, dispensing  Tobacco Use   Smoking status: Former    Packs/day: 1.00    Years: 4.00    Pack years: 4.00    Types: Cigarettes    Quit date: 2004    Years since quitting: 18.9   Smokeless tobacco: Never  Vaping Use   Vaping Use: Never used  Substance and Sexual Activity   Alcohol use: Yes    Comment: occ   Drug use: No   Sexual activity: Yes    Birth control/protection: None  Other Topics Concern   Not on file  Social History Narrative   Lives with his family.   Right-handed.   Caffeine use: 4 cups per day.   Social Determinants of Health   Financial Resource Strain: Not on file  Food Insecurity: Not on file  Transportation Needs: Not on file  Physical Activity: Not on file  Stress: Not on file  Social Connections: Not on file  Intimate Partner Violence: Not on file      PHYSICAL EXAM  Vitals:   03/26/21 0831  BP: (!) 143/96  Pulse: 86  Weight: 266 lb 9.6 oz (120.9 kg)  Height: 6\' 3"  (1.905 m)   Body mass index is 33.32 kg/m.  Generalized: Well developed, in no acute distress   Neurological examination  Mentation: Alert oriented to time, place, history taking. Follows all commands speech and language fluent Cranial nerve II-XII: Pupils were equal round reactive to light. Extraocular movements were full, visual field were full on confrontational test. Facial sensation and strength were normal. Uvula tongue midline. Head turning and shoulder shrug  were normal and symmetric. Motor: The motor testing reveals 5 over 5 strength of all 4 extremities. Good symmetric motor tone is noted throughout.  Sensory: Sensory testing is intact to soft touch on all 4 extremities. No evidence of extinction is noted.  Coordination: Cerebellar testing reveals good finger-nose-finger and  heel-to-shin bilaterally.  Gait and station: Gait is normal Reflexes: Deep tendon reflexes are symmetric and normal bilaterally.   DIAGNOSTIC DATA (LABS, IMAGING, TESTING) - I reviewed patient records, labs, notes, testing and imaging myself where available.  Lab Results  Component Value Date   WBC 5.9 06/20/2020   HGB 13.6 06/20/2020   HCT 41.9 06/20/2020   MCV 86 06/20/2020   PLT 205 06/20/2020      Component Value Date/Time   NA 143 06/20/2020 0842   K 4.3 06/20/2020 0842   CL 105 06/20/2020 0842   CO2 25 06/20/2020 0842   GLUCOSE 87 06/20/2020 0842   GLUCOSE 123 (H) 03/27/2020 0838   BUN 11 06/20/2020  5809   CREATININE 1.14 10/14/2020 0931   CALCIUM 9.3 06/20/2020 0842   PROT 6.4 06/20/2020 0842   ALBUMIN 4.1 06/20/2020 0842   AST 24 06/20/2020 0842   ALT 25 06/20/2020 0842   ALKPHOS 61 06/20/2020 0842   BILITOT <0.2 06/20/2020 0842   GFRNONAA >60 03/27/2020 0838   GFRAA 97 02/23/2020 0933   Lab Results  Component Value Date   CHOL 134 02/23/2020   HDL 45 02/23/2020   LDLCALC 80 02/23/2020   TRIG 33 02/23/2020   CHOLHDL 3.0 02/23/2020   Lab Results  Component Value Date   HGBA1C 5.7 (H) 06/20/2020   Lab Results  Component Value Date   VITAMINB12 1,933 (H) 05/19/2018   Lab Results  Component Value Date   TSH 0.380 (L) 06/20/2020      ASSESSMENT AND PLAN 45 y.o. year old male  has a past medical history of Bell's palsy, Chronic sinusitis, Left-sided Bell's palsy (04/12/2020), Seasonal allergies, Shingles, and Vertigo (11/20/2020). here with:  1.  Vertigo 2.  Bell's palsy  Encourage patient to follow-up with ENT for further testing Advised that we could refer to vestibular rehab however the patient would like to have evaluation by ENT before any treatment options are discussed. Follow-up if needed.     Butch Penny, MSN, NP-C 03/26/2021, 8:45 AM [facility] [address] [place], [place] [address] [phone]

## 2021-03-26 NOTE — Addendum Note (Signed)
Addended by: Guy Begin on: 03/26/2021 10:49 AM   Modules accepted: Orders

## 2021-03-26 NOTE — Telephone Encounter (Signed)
Placed referral for ENT (to be cosigned by MM/NP).

## 2021-03-26 NOTE — Telephone Encounter (Signed)
We received a call from [facility], they state the patient had called in to their office requesting an appointment for Meniere's disease. They advised that they require a referral before scheduling. He stated that his neurologist here retired, as did his prior ENT. She called wanting to see if we would be able to send a referral to them for him.  Best fax for the referral is #[phone]

## 2021-03-26 NOTE — Patient Instructions (Addendum)
Follow up ENT -- Dr. Annalee Genta or Dr. Jenne Pane

## 2021-04-22 ENCOUNTER — Encounter: Payer: Self-pay | Admitting: Physician Assistant

## 2021-05-12 NOTE — Progress Notes (Deleted)
Complete physical exam   Patient: Warren Herrera   DOB: June 05, 1975   46 y.o. Male  MRN: [id number] Visit Date: 05/16/2021   No chief complaint on file.  Subjective    Warren Herrera is a 46 y.o. male who presents today for a complete physical exam.  He reports consuming a {diet types:17450} diet. {Exercise:19826} He generally feels {well/fairly well/poorly:18703}. He {does/does not:200015} have additional problems to discuss today.   ***  Past Medical History:  Diagnosis Date   Bell's palsy    Chronic sinusitis    Left-sided Bell's palsy 04/12/2020   Seasonal allergies    Shingles    Vertigo 11/20/2020   Past Surgical History:  Procedure Laterality Date   NASAL SINUS SURGERY     Social History   Socioeconomic History   Marital status: Married    Spouse name: Not on file   Number of children: 4   Years of education: college   Highest education level: Bachelor's degree (e.g., BA, AB, BS)  Occupational History   Occupation: Company secretary  Tobacco Use   Smoking status: Former    Packs/day: 1.00    Years: 4.00    Pack years: 4.00    Types: Cigarettes    Quit date: 2004    Years since quitting: 19.1   Smokeless tobacco: Never  Vaping Use   Vaping Use: Never used  Substance and Sexual Activity   Alcohol use: Yes    Comment: occ   Drug use: No   Sexual activity: Yes    Birth control/protection: None  Other Topics Concern   Not on file  Social History Narrative   Lives with his family.   Right-handed.   Caffeine use: 4 cups per day.   Social Determinants of Health   Financial Resource Strain: Not on file  Food Insecurity: Not on file  Transportation Needs: Not on file  Physical Activity: Not on file  Stress: Not on file  Social Connections: Not on file  Intimate Partner Violence: Not on file     Medications: Outpatient Medications Prior to Visit  Medication Sig   Cholecalciferol (VITAMIN D3) 125 MCG (5000 UT) TABS 5,000 IU OTC vitamin D3 daily.    cyclobenzaprine (FLEXERIL) 10 MG tablet TAKE 1 TABLET BY MOUTH AT BEDTIME   fluticasone (FLONASE) 50 MCG/ACT nasal spray 1 spray 2 times daily after neil med or AYR sinus rinses   hydrochlorothiazide (HYDRODIURIL) 12.5 MG tablet Take 1 tablet (12.5 mg total) by mouth daily.   loratadine-pseudoephedrine (CLARITIN-D 12-HOUR) 5-120 MG tablet Take 1 tablet by mouth 2 (two) times daily.   meclizine (ANTIVERT) 25 MG tablet TAKE 1 TABLET BY MOUTH THREE TIMES DAILY AS NEEDED FOR DIZZINESS   multivitamin (ONE-A-DAY MEN'S) TABS tablet Take 1 tablet by mouth daily.   pregabalin (LYRICA) 25 MG capsule 2 capsules at night   No facility-administered medications prior to visit.    Review of Systems  {Labs   Heme   Chem   Endocrine   Serology   Results Review (optional):23779}  Objective    There were no vitals taken for this visit. {Show previous vital signs (optional):23777}  Physical Exam  ***  Last depression screening scores PHQ 2/9 Scores 01/16/2021 10/10/2020 08/14/2020  PHQ - 2 Score 2 2 2   PHQ- 9 Score 7 8 8    Last fall risk screening Fall Risk  01/16/2021  Falls in the past year? 0  Number falls in past yr: 0  Injury with Fall? 0  Risk for fall due to : -  Follow up Falls evaluation completed     No results found for any visits on 05/16/21.  Assessment & Plan    Routine Health Maintenance and Physical Exam  Exercise Activities and Dietary recommendations -Discussed heart healthy diet low in fat and carbohydrates. Recommend moderate exercise 150 mins/wk.  Immunization History  Administered Date(s) Administered   Influenza-Unspecified 02/01/2015    Health Maintenance  Topic Date Due   COVID-19 Vaccine (1) Never done   TETANUS/TDAP  Never done   INFLUENZA VACCINE  11/04/2020   Hepatitis C Screening  Completed   HIV Screening  Completed   HPV VACCINES  Aged Out    Discussed health benefits of physical activity, and encouraged him to engage in regular exercise  appropriate for his age and condition.  Problem List Items Addressed This Visit   None    No follow-ups on file.       Lorrene Reid, PA-C  [facility] at [facility] [phone] (phone) [phone] (fax)  [place]

## 2021-05-16 ENCOUNTER — Encounter: Payer: Self-pay | Admitting: Physician Assistant

## 2021-05-23 DIAGNOSIS — R42 Dizziness and giddiness: Secondary | ICD-10-CM | POA: Insufficient documentation

## 2021-05-23 DIAGNOSIS — H9312 Tinnitus, left ear: Secondary | ICD-10-CM | POA: Insufficient documentation

## 2021-07-10 ENCOUNTER — Other Ambulatory Visit: Payer: Self-pay | Admitting: Physician Assistant

## 2021-07-10 DIAGNOSIS — R42 Dizziness and giddiness: Secondary | ICD-10-CM

## 2021-07-10 DIAGNOSIS — I10 Essential (primary) hypertension: Secondary | ICD-10-CM

## 2021-07-22 ENCOUNTER — Telehealth: Payer: Self-pay | Admitting: Physician Assistant

## 2021-07-22 ENCOUNTER — Other Ambulatory Visit: Payer: Self-pay

## 2021-07-22 DIAGNOSIS — R42 Dizziness and giddiness: Secondary | ICD-10-CM

## 2021-07-22 MED ORDER — MECLIZINE HCL 25 MG PO TABS: ORAL_TABLET | ORAL | 0 refills | Status: DC

## 2021-07-22 NOTE — Telephone Encounter (Signed)
Patient is aware 

## 2021-07-22 NOTE — Telephone Encounter (Signed)
Refill sent. Have the patient schedule a follow up visit for future med refills.  ?

## 2021-07-22 NOTE — Telephone Encounter (Signed)
Patient requesting refill of Meclizine. Please advise. [phone] ?

## 2021-07-29 ENCOUNTER — Ambulatory Visit: Payer: Self-pay | Admitting: Physician Assistant

## 2021-12-03 NOTE — Progress Notes (Unsigned)
  Established patient visit   Patient: Warren Herrera   DOB: Aug 12, 1975   46 y.o. Male  MRN: [id number] Visit Date: 12/04/2021  No chief complaint on file.  Subjective    HPI  Patient presents for medication refills.  HTN: Pt denies chest pain, palpitations, dizziness or leg swelling. Taking medication as directed without side effects. Checks BP at home ***times/wk and readings range in ***. Pt follows a low salt diet.   Medications: Outpatient Medications Prior to Visit  Medication Sig   Cholecalciferol (VITAMIN D3) 125 MCG (5000 UT) TABS 5,000 IU OTC vitamin D3 daily.   cyclobenzaprine (FLEXERIL) 10 MG tablet TAKE 1 TABLET BY MOUTH AT BEDTIME   fluticasone (FLONASE) 50 MCG/ACT nasal spray 1 spray 2 times daily after neil med or AYR sinus rinses   hydrochlorothiazide (HYDRODIURIL) 12.5 MG tablet Take 1 tablet by mouth once daily   loratadine-pseudoephedrine (CLARITIN-D 12-HOUR) 5-120 MG tablet Take 1 tablet by mouth 2 (two) times daily.   meclizine (ANTIVERT) 25 MG tablet TAKE 1 TABLET BY MOUTH THREE TIMES DAILY AS NEEDED FOR DIZZINESS   multivitamin (ONE-A-DAY MEN'S) TABS tablet Take 1 tablet by mouth daily.   pregabalin (LYRICA) 25 MG capsule 2 capsules at night   No facility-administered medications prior to visit.    Review of Systems  {Labs  Heme  Chem  Endocrine  Serology  Results Review (optional):23779}   Objective    There were no vitals taken for this visit. BP Readings from Last 3 Encounters:  03/26/21 (!) 143/96  01/16/21 135/85  11/20/20 (!) 145/101   Wt Readings from Last 3 Encounters:  03/26/21 266 lb 9.6 oz (120.9 kg)  01/16/21 249 lb 6.4 oz (113.1 kg)  11/20/20 251 lb (113.9 kg)    Physical Exam  ***  No results found for any visits on 12/04/21.  Assessment & Plan     *** Problem List Items Addressed This Visit   None   No follow-ups on file.        Mayer Masker, PA-C  [facility] at [facility] [phone]  (phone) [phone] (fax)  [facility]

## 2021-12-04 ENCOUNTER — Encounter: Payer: Self-pay | Admitting: Physician Assistant

## 2021-12-04 ENCOUNTER — Ambulatory Visit (INDEPENDENT_AMBULATORY_CARE_PROVIDER_SITE_OTHER): Payer: Self-pay | Admitting: Physician Assistant

## 2021-12-04 VITALS — BP 137/82 | HR 96 | Temp 97.7°F | Ht 75.0 in | Wt 275.0 lb

## 2021-12-04 DIAGNOSIS — I10 Essential (primary) hypertension: Secondary | ICD-10-CM

## 2021-12-04 DIAGNOSIS — R42 Dizziness and giddiness: Secondary | ICD-10-CM

## 2021-12-04 MED ORDER — MECLIZINE HCL 25 MG PO TABS: ORAL_TABLET | ORAL | 0 refills | Status: AC

## 2021-12-04 MED ORDER — HYDROCHLOROTHIAZIDE 12.5 MG PO TABS: 12.5000 mg | ORAL_TABLET | Freq: Every day | ORAL | 0 refills | Status: DC

## 2021-12-04 NOTE — Patient Instructions (Signed)
Depression and Anxiety You will learn about mood disorders, how these conditions can occur in people with heart disease, and why it is important to treat these conditions. To view the content, go to this web address: [email or web address]  This video will expire on: 09/27/2023. If you need access to this video following this date, please reach out to the healthcare provider who assigned it to you. This information is not intended to replace advice given to you by your health care provider. Make sure you discuss any questions you have with your health care provider. Elsevier Patient Education  2023 [employer]vier Inc[employer]  

## 2021-12-04 NOTE — Assessment & Plan Note (Signed)
-  Stable. Continue meclizine 25 mg TID as needed for dizziness. Provided refill. Will continue to monitor.

## 2021-12-04 NOTE — Assessment & Plan Note (Signed)
>>  ASSESSMENT AND PLAN FOR VERTIGO WRITTEN ON 12/04/2021 10:23 AM BY ABONZA, MARITZA, PA-C  -Stable. Continue meclizine 25 mg TID as needed for dizziness. Provided refill. Will continue to monitor.

## 2021-12-04 NOTE — Assessment & Plan Note (Signed)
-  BP in office stable, provided medication refill. Will continue to monitor.

## 2022-03-10 ENCOUNTER — Encounter: Payer: Self-pay | Admitting: Nurse Practitioner

## 2022-03-10 ENCOUNTER — Other Ambulatory Visit: Payer: Self-pay

## 2022-03-10 DIAGNOSIS — Z0000 Encounter for general adult medical examination without abnormal findings: Secondary | ICD-10-CM

## 2022-03-10 DIAGNOSIS — Z130 Encounter for screening for diseases of the blood and blood-forming organs and certain disorders involving the immune mechanism: Secondary | ICD-10-CM

## 2022-03-12 ENCOUNTER — Other Ambulatory Visit: Payer: Self-pay

## 2022-03-12 DIAGNOSIS — Z0000 Encounter for general adult medical examination without abnormal findings: Secondary | ICD-10-CM

## 2022-03-12 DIAGNOSIS — Z130 Encounter for screening for diseases of the blood and blood-forming organs and certain disorders involving the immune mechanism: Secondary | ICD-10-CM

## 2022-03-13 LAB — COMPREHENSIVE METABOLIC PANEL
ALT: 48 IU/L — ABNORMAL HIGH (ref 0–44)
AST: 47 IU/L — ABNORMAL HIGH (ref 0–40)
Albumin/Globulin Ratio: 1.7 (ref 1.2–2.2)
Albumin: 4.5 g/dL (ref 4.1–5.1)
Alkaline Phosphatase: 69 IU/L (ref 44–121)
BUN/Creatinine Ratio: 10 (ref 9–20)
BUN: 11 mg/dL (ref 6–24)
Bilirubin Total: 0.3 mg/dL (ref 0.0–1.2)
CO2: 24 mmol/L (ref 20–29)
Calcium: 9.6 mg/dL (ref 8.7–10.2)
Chloride: 102 mmol/L (ref 96–106)
Creatinine, Ser: 1.13 mg/dL (ref 0.76–1.27)
Globulin, Total: 2.7 g/dL (ref 1.5–4.5)
Glucose: 89 mg/dL (ref 70–99)
Potassium: 5.2 mmol/L (ref 3.5–5.2)
Sodium: 141 mmol/L (ref 134–144)
Total Protein: 7.2 g/dL (ref 6.0–8.5)
eGFR: 82 mL/min/{1.73_m2} (ref >59)

## 2022-03-13 LAB — CBC
Hematocrit: 44.4 % (ref 37.5–51.0)
Hemoglobin: 14.3 g/dL (ref 13.0–17.7)
MCH: 27.4 pg (ref 26.6–33.0)
MCHC: 32.2 g/dL (ref 31.5–35.7)
MCV: 85 fL (ref 79–97)
Platelets: 268 10*3/uL (ref 150–450)
RBC: 5.21 x10E6/uL (ref 4.14–5.80)
RDW: 12.6 % (ref 11.6–15.4)
WBC: 4.3 10*3/uL (ref 3.4–10.8)

## 2022-03-13 LAB — LIPID PANEL
Chol/HDL Ratio: 3.1 ratio (ref 0.0–5.0)
Cholesterol, Total: 168 mg/dL (ref 100–199)
HDL: 55 mg/dL (ref >39)
LDL Chol Calc (NIH): 102 mg/dL — ABNORMAL HIGH (ref 0–99)
Triglycerides: 55 mg/dL (ref 0–149)
VLDL Cholesterol Cal: 11 mg/dL (ref 5–40)

## 2022-03-13 LAB — HEMOGLOBIN A1C
Est. average glucose Bld gHb Est-mCnc: 123 mg/dL
Hgb A1c MFr Bld: 5.9 % — ABNORMAL HIGH (ref 4.8–5.6)

## 2022-03-13 LAB — TSH: TSH: 0.602 u[IU]/mL (ref 0.450–4.500)

## 2022-03-19 ENCOUNTER — Encounter: Payer: Self-pay | Admitting: Nurse Practitioner

## 2022-05-20 ENCOUNTER — Encounter: Payer: Self-pay | Admitting: Physician Assistant

## 2022-05-20 NOTE — Progress Notes (Deleted)
Complete physical exam  Patient: Warren Herrera   DOB: 1975-08-21   47 y.o. Male  MRN: [id number]  Subjective:    No chief complaint on file.   Warren Herrera is a 47 y.o. male who presents today for a complete physical exam. He reports consuming a {diet types:17450} diet. {types:19826} He generally feels {DESC; WELL/FAIRLY WELL/POORLY:18703}. He reports sleeping {DESC; WELL/FAIRLY WELL/POORLY:18703}. He {does/does not:200015} have additional problems to discuss today.    Most recent fall risk assessment:    12/04/2021    9:57 AM  Fall Risk   Falls in the past year? 0  Number falls in past yr: 0  Injury with Fall? 0  Risk for fall due to : No Fall Risks  Follow up Falls evaluation completed     Most recent depression screenings:    12/04/2021    9:57 AM 01/16/2021    9:21 AM  PHQ 2/9 Scores  PHQ - 2 Score 2 2  PHQ- 9 Score 7 7    {VISON DENTAL STD PSA (Optional):27386}  {History (Optional):23778}  Patient Care Team: Velva Harman, PA as PCP - General (Family Medicine) Bernerd Limbo, MD as Referring Physician (Family Medicine) Rozetta Nunnery, MD (Inactive) as Consulting Physician (Otolaryngology)   Outpatient Medications Prior to Visit  Medication Sig   Cholecalciferol (VITAMIN D3) 125 MCG (5000 UT) TABS 5,000 IU OTC vitamin D3 daily.   cyclobenzaprine (FLEXERIL) 10 MG tablet TAKE 1 TABLET BY MOUTH AT BEDTIME   fluticasone (FLONASE) 50 MCG/ACT nasal spray 1 spray 2 times daily after neil med or AYR sinus rinses   hydrochlorothiazide (HYDRODIURIL) 12.5 MG tablet Take 1 tablet (12.5 mg total) by mouth daily.   loratadine-pseudoephedrine (CLARITIN-D 12-HOUR) 5-120 MG tablet Take 1 tablet by mouth 2 (two) times daily.   meclizine (ANTIVERT) 25 MG tablet TAKE 1 TABLET BY MOUTH THREE TIMES DAILY AS NEEDED FOR DIZZINESS   multivitamin (ONE-A-DAY MEN'S) TABS tablet Take 1 tablet by mouth daily.   pregabalin (LYRICA) 25 MG capsule 2 capsules at night   No  facility-administered medications prior to visit.    ROS        Objective:     There were no vitals taken for this visit. {Vitals History (Optional):23777}  Physical Exam   No results found for any visits on 05/22/22. {Show previous labs (optional):23779}    Assessment & Plan:    Routine Health Maintenance and Physical Exam  Immunization History  Administered Date(s) Administered   Influenza-Unspecified 02/01/2015    Health Maintenance  Topic Date Due   COVID-19 Vaccine (1) Never done   DTaP/Tdap/Td (1 - Tdap) Never done   INFLUENZA VACCINE  11/04/2021   COLONOSCOPY (Pts 45-50yr Insurance coverage will need to be confirmed)  Never done   Hepatitis C Screening  Completed   HIV Screening  Completed   HPV VACCINES  Aged Out    Discussed health benefits of physical activity, and encouraged him to engage in regular exercise appropriate for his age and condition.  Problem List Items Addressed This Visit       Cardiovascular and Mediastinum   Primary hypertension    BP in office stable, provided medication refill. Will continue to monitor.         Other   BMI 35.0-35.9,adult   Vitamin D insufficiency    -Last Vitamin D 26.7 -Repeating Vitamin D today. -Continue Vitamin D3 supplement.      Prediabetes    -Last A1c 5.8, will repeat  today. -Continue to stay active and monitor carbohydrates and glucose. -Will continue to monitor.      Adjustment disorder with mixed anxiety and depressed mood    -PHQ-9 score of 5, mildly improved from prior. -Patient intolerant to Sertraline and prefers to pursue [facility] therapy so will place referral. -Encourage to continue with exercise routine. -Will continue to monitor.      Intermittent vertigo    Stable. Continue meclizine 25 mg TID as needed for dizziness. Provided refill. Will continue to monitor.       Other Visit Diagnoses     Wellness examination    -  Primary      No follow-ups on file.     Velva Harman, PA

## 2022-05-22 ENCOUNTER — Encounter: Payer: Self-pay | Admitting: Family Medicine

## 2022-05-22 NOTE — Assessment & Plan Note (Deleted)
-  Last A1c 5.8, will repeat today. -Continue to stay active and monitor carbohydrates and glucose. -Will continue to monitor.

## 2022-05-22 NOTE — Assessment & Plan Note (Deleted)
-  PHQ-9 score of 5, mildly improved from prior. -Patient intolerant to Sertraline and prefers to pursue [facility] therapy so will place referral. -Encourage to continue with exercise routine. -Will continue to monitor.

## 2022-05-22 NOTE — Assessment & Plan Note (Deleted)
BP in office stable, provided medication refill. Will continue to monitor.

## 2022-05-22 NOTE — Assessment & Plan Note (Deleted)
-  Last Vitamin D 26.7 -Repeating Vitamin D today. -Continue Vitamin D3 supplement.

## 2022-05-22 NOTE — Assessment & Plan Note (Deleted)
Stable. Continue meclizine 25 mg TID as needed for dizziness. Provided refill. Will continue to monitor.

## 2022-07-01 IMAGING — CT CT HEAD W/O CM
3 series · 15 of 47 positions shown, 18 images · non-contrast
Comparison: None.

CLINICAL DATA: Headache.  Blurred vision.  Dizziness.

EXAM:
CT HEAD WITHOUT CONTRAST
TECHNIQUE: Contiguous axial images were obtained from the base of the skull
through the vertex without intravenous contrast.

[Series 3: head wo · axial · 0.46mm/px · z∈[-117,+8]mm · 9 of 31 slices shown, 12 images]
[im 3/31  brain]
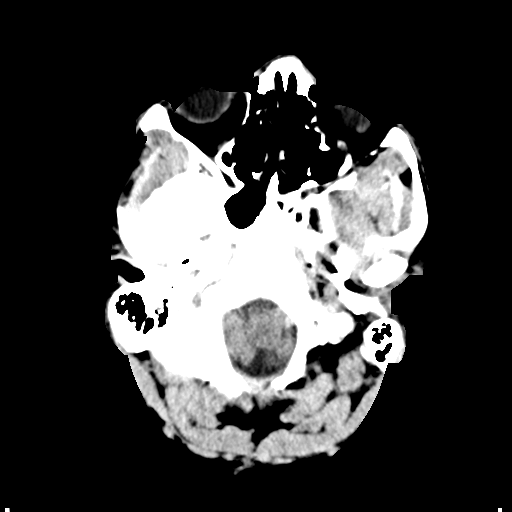
[im 3/31  bone]
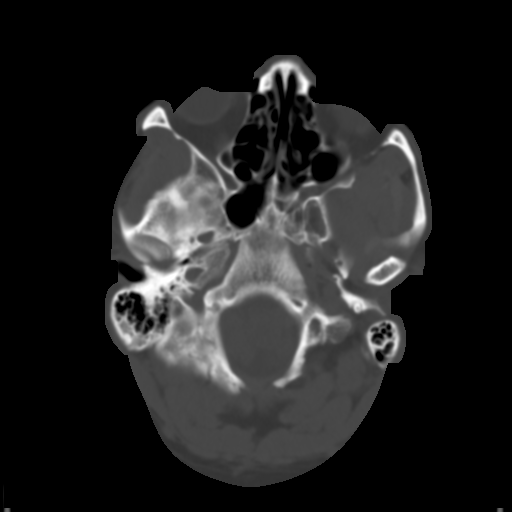
[im 6/31  brain]
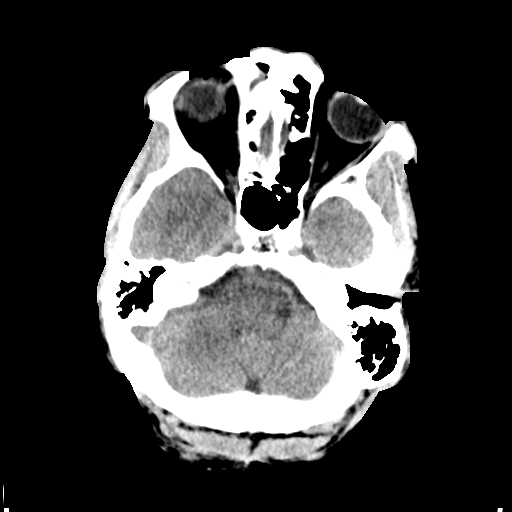
[im 9/31  brain]
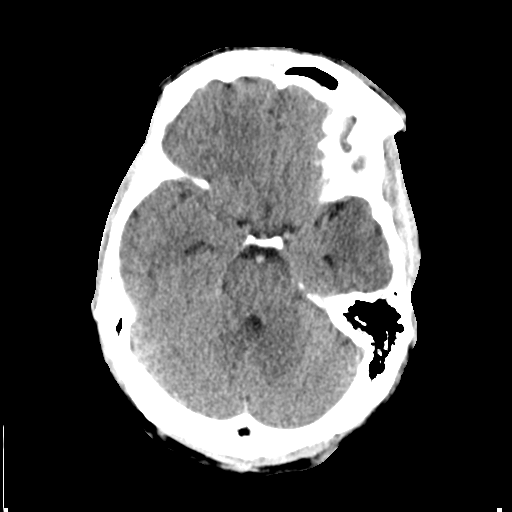
[im 12/31  brain]
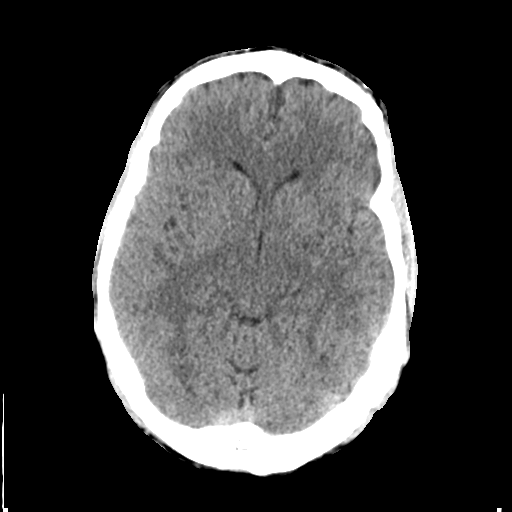
[im 16/31  brain]
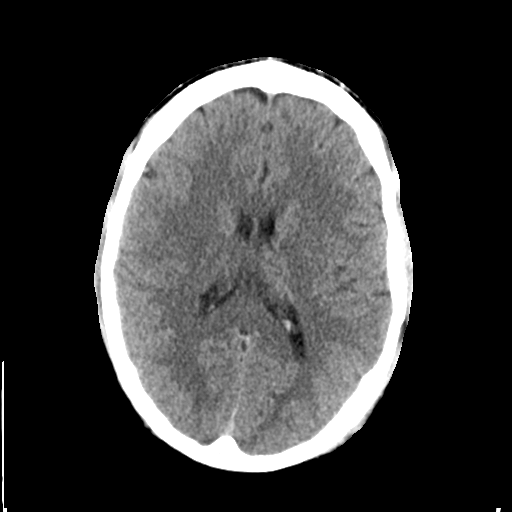
[im 16/31  bone]
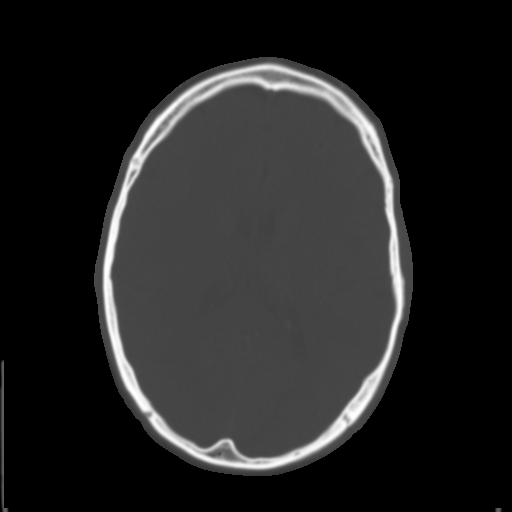
[im 19/31  brain]
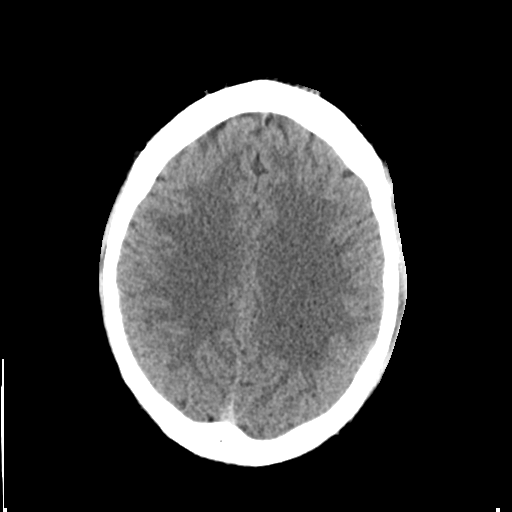
[im 22/31  brain]
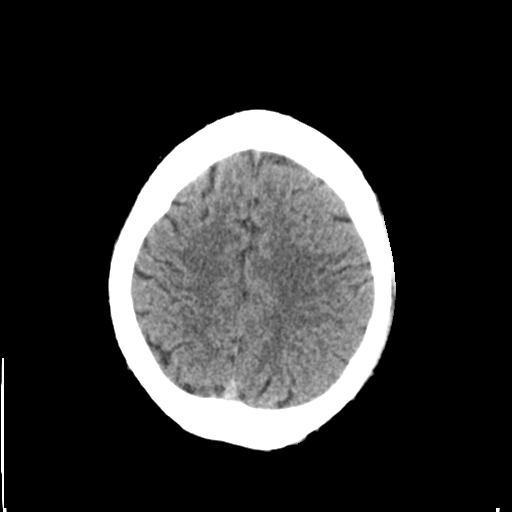
[im 25/31  brain]
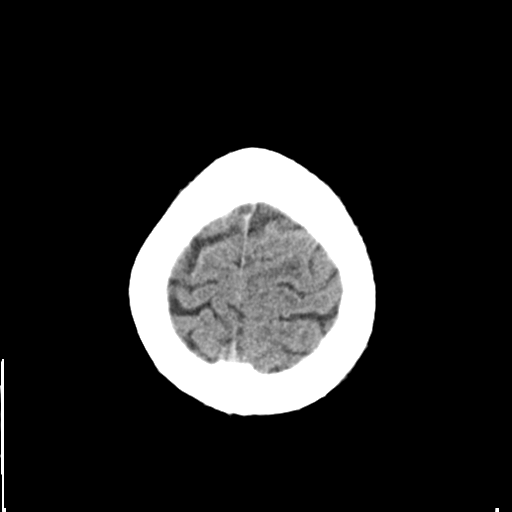
[im 28/31  brain]
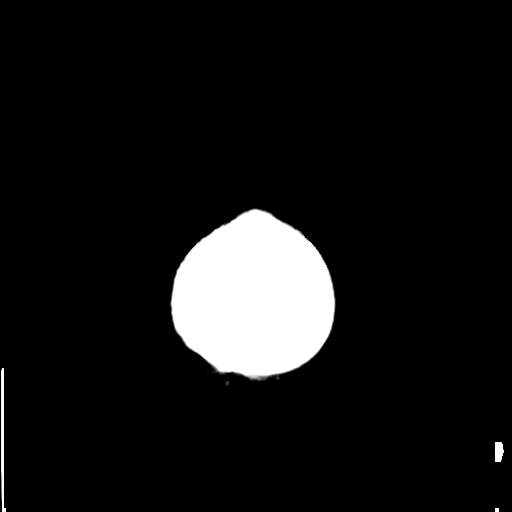
[im 28/31  bone]
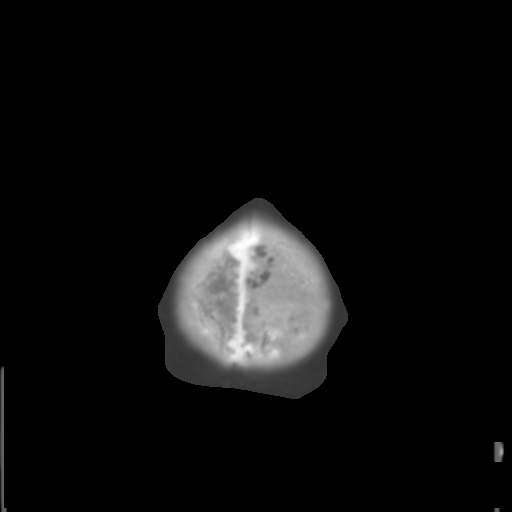

[Series 4: coronal soft tissue · coronal · 0.35mm/px · 3 of 71 slices shown]
[im 24/71  brain]
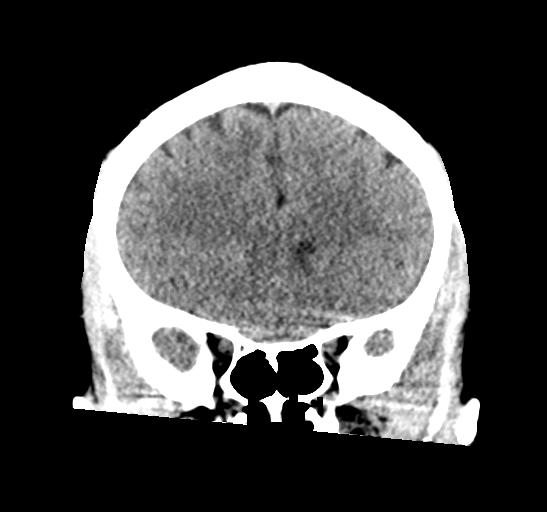
[im 32/71  brain]
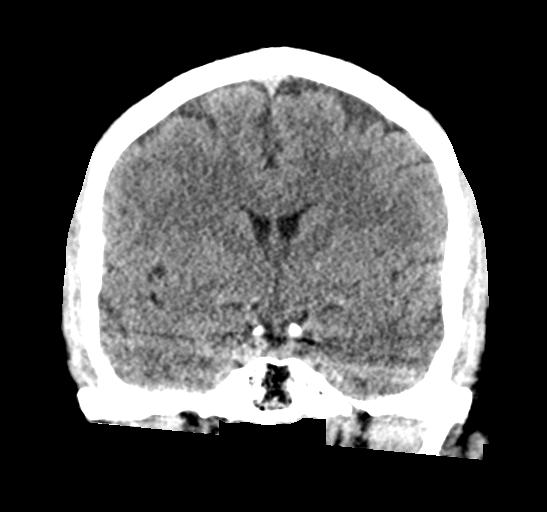
[im 39/71  brain]
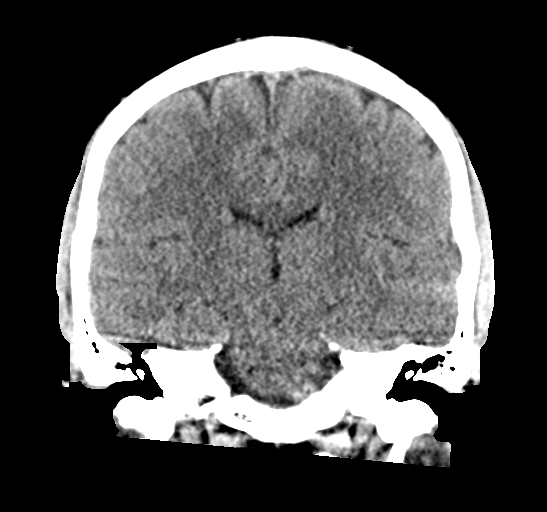

[Series 5: sagittal soft tissue · sagittal · 0.34mm/px · 3 of 56 slices shown]
[im 19/56  brain]
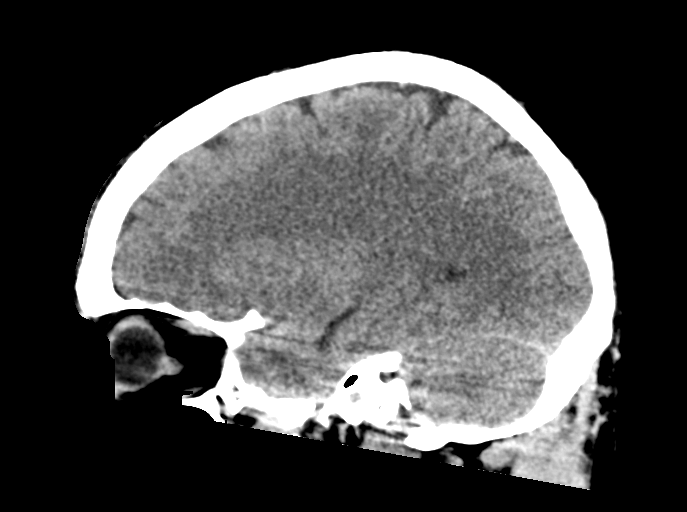
[im 28/56  brain]
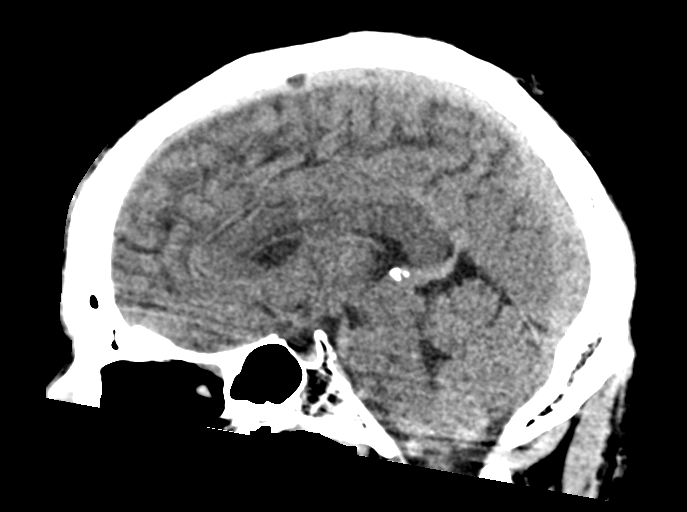
[im 37/56  brain]
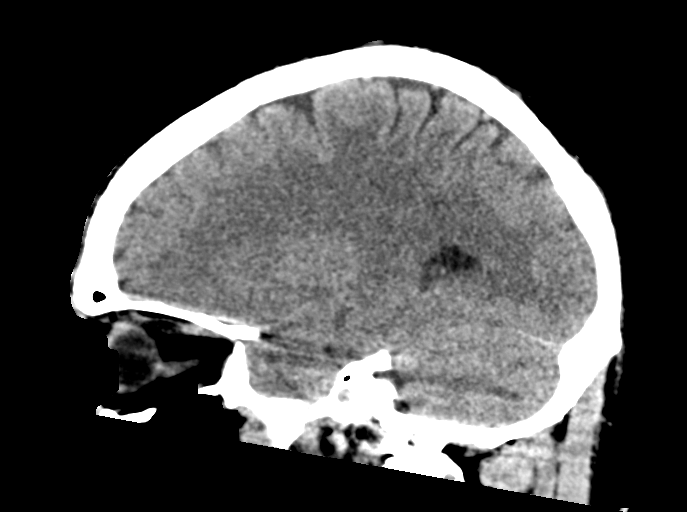

[15 of 47 positions shown; findings below may reference images not displayed]

FINDINGS: Brain: No evidence of acute large vascular territory infarction,
hemorrhage, hydrocephalus, extra-axial collection or mass
lesion/mass effect.

Vascular: When accounting for streak artifact, no convincing
hyperdense vessel.

Skull: No acute fracture.

Sinuses/Orbits: Mucosal thickening of the right frontal sinus,
scattered ethmoid air cells, and left sphenoid sinus.

Other: No mastoid effusions.
IMPRESSION: 1. No evidence of acute intracranial abnormality.
2. Nonspecific frontoethmoidal and left sphenoid sinus mucosal
thickening. Correlate with signs/symptoms of sinusitis.

## 2022-10-05 NOTE — Progress Notes (Signed)
Mildly elevated hemoglobin A1c with normal glucose at 89.  Mild elevation of LDL at 102.  Mild elevation of LFTs.  Discussed at next visit.

## 2023-05-13 ENCOUNTER — Encounter: Payer: Self-pay | Admitting: Family Medicine

## 2023-05-15 ENCOUNTER — Encounter: Payer: Self-pay | Admitting: Family Medicine

## 2023-09-07 ENCOUNTER — Encounter: Payer: Self-pay | Admitting: Family Medicine

## 2023-09-07 ENCOUNTER — Ambulatory Visit: Payer: Self-pay | Admitting: Family Medicine

## 2023-09-07 VITALS — BP 150/102 | HR 100 | Temp 98.2°F | Ht 75.0 in | Wt 293.0 lb

## 2023-09-07 DIAGNOSIS — I10 Essential (primary) hypertension: Secondary | ICD-10-CM | POA: Diagnosis not present

## 2023-09-07 DIAGNOSIS — J329 Chronic sinusitis, unspecified: Secondary | ICD-10-CM | POA: Diagnosis not present

## 2023-09-07 DIAGNOSIS — Z7689 Persons encountering health services in other specified circumstances: Secondary | ICD-10-CM

## 2023-09-07 DIAGNOSIS — Z1211 Encounter for screening for malignant neoplasm of colon: Secondary | ICD-10-CM

## 2023-09-07 DIAGNOSIS — E66812 Obesity, class 2: Secondary | ICD-10-CM

## 2023-09-07 DIAGNOSIS — D72819 Decreased white blood cell count, unspecified: Secondary | ICD-10-CM

## 2023-09-07 DIAGNOSIS — Z6836 Body mass index (BMI) 36.0-36.9, adult: Secondary | ICD-10-CM

## 2023-09-07 LAB — CBC WITH DIFFERENTIAL/PLATELET
Basophils Absolute: 0 10*3/uL (ref 0.0–0.1)
Basophils Relative: 0.2 % (ref 0.0–3.0)
Eosinophils Absolute: 0.1 10*3/uL (ref 0.0–0.7)
Eosinophils Relative: 3.9 % (ref 0.0–5.0)
HCT: 41.4 % (ref 39.0–52.0)
Hemoglobin: 13.5 g/dL (ref 13.0–17.0)
Lymphocytes Relative: 36.8 % (ref 12.0–46.0)
Lymphs Abs: 1.4 10*3/uL (ref 0.7–4.0)
MCHC: 32.5 g/dL (ref 30.0–36.0)
MCV: 82.5 fl (ref 78.0–100.0)
Monocytes Absolute: 0.3 10*3/uL (ref 0.1–1.0)
Monocytes Relative: 8.4 % (ref 3.0–12.0)
Neutro Abs: 1.9 10*3/uL (ref 1.4–7.7)
Neutrophils Relative %: 50.7 % (ref 43.0–77.0)
Platelets: 221 10*3/uL (ref 150.0–400.0)
RBC: 5.02 Mil/uL (ref 4.22–5.81)
RDW: 14.1 % (ref 11.5–15.5)
WBC: 3.7 10*3/uL — ABNORMAL LOW (ref 4.0–10.5)

## 2023-09-07 LAB — COMPREHENSIVE METABOLIC PANEL WITH GFR
ALT: 32 U/L (ref 0–53)
AST: 30 U/L (ref 0–37)
Albumin: 4.2 g/dL (ref 3.5–5.2)
Alkaline Phosphatase: 64 U/L (ref 39–117)
BUN: 15 mg/dL (ref 6–23)
CO2: 30 meq/L (ref 19–32)
Calcium: 9.4 mg/dL (ref 8.4–10.5)
Chloride: 104 meq/L (ref 96–112)
Creatinine, Ser: 1.18 mg/dL (ref 0.40–1.50)
GFR: 73.93 mL/min (ref >60.00)
Glucose, Bld: 98 mg/dL (ref 70–99)
Potassium: 4.2 meq/L (ref 3.5–5.1)
Sodium: 138 meq/L (ref 135–145)
Total Bilirubin: 0.4 mg/dL (ref 0.2–1.2)
Total Protein: 7.1 g/dL (ref 6.0–8.3)

## 2023-09-07 LAB — TSH: TSH: 0.57 u[IU]/mL (ref 0.35–5.50)

## 2023-09-07 LAB — HEMOGLOBIN A1C: Hgb A1c MFr Bld: 6.1 % (ref 4.6–6.5)

## 2023-09-07 MED ORDER — HYDROCHLOROTHIAZIDE 25 MG PO TABS: 25.0000 mg | ORAL_TABLET | Freq: Every day | ORAL | 3 refills | Status: AC

## 2023-09-07 NOTE — Patient Instructions (Addendum)
-  It was nice to meet you and look forward to providing your primary care. -Placed a referral to GI for colon cancer screening. Please call the office or send a MyChart message if you do not receive a phone call or a MyChart message about appointment in 2 weeks.  -Recommend to obtain Tdap (tetanus) vaccine every 10 years or if an injury occurs. -Placed a referral to ENT for chronic sinusitis. Please call the office or send a MyChart message if you do not receive a phone call or a MyChart message about appointment in 2 weeks.   -Ordered labs. Office will call with lab results and you will see them on MyChart.  -Blood pressure is elevated. Prescribed Hydrochlorothiazide  25mg  daily in the AM. Recommend to monitor your blood pressure with an upper arm cuff twice a day. Please bring readings to your next appointment.  -Discussed about warning signs of a stroke and what to do if symptoms occurred.  -Follow up in 2 weeks and please be fasting to have lipids drawn.

## 2023-09-07 NOTE — Assessment & Plan Note (Signed)
-  Blood pressure is elevated. Prescribed Hydrochlorothiazide  25mg  daily in the AM. Recommend to monitor his blood pressure with an upper arm cuff twice a day. Please bring readings to his next appointment.  -Discussed about warning signs of a stroke and what to do if symptoms occurred.  -Patient was previously on HCTZ, he prefers to restart medication. Concerned that HCTZ may not be enough to control BP.  -Ordered CMP to assess kidney function and potassium level.

## 2023-09-07 NOTE — Addendum Note (Signed)
 Addended by: Teodoro Feller B on: 09/07/2023 04:39 PM   Modules accepted: Orders

## 2023-09-07 NOTE — Progress Notes (Signed)
 New Patient Office Visit  Subjective   Patient ID: Warren Herrera, male    DOB: 1975-09-25  Age: 48 y.o. MRN: [id number]  CC:  Chief Complaint  Patient presents with   Establish Care    HPI [name] presents to establish care with new provider.   Patients previous primary care provider: [name] with [name], PA. Last seen on 12/04/2021.   Specialist: None   HTN: Chronic. Patient is not taking medication, but previously took HTCZ 12.5mg  daily. Been monitoring blood pressure at home until about 2 months ago. Denies CP, SHOB, or lower extremity edema. Patient reports dizziness daily and intermittent mild headaches.   Patient is requesting a referral to ENT for chronic sinusitis. Based on chart review, patient previously seen [facility] ENT with [name] in February 2023. Also, has a history of nasal sinus surgery.   All other medications he is taking OTC.   Outpatient Encounter Medications as of 09/07/2023  Medication Sig   Cholecalciferol (VITAMIN D3) 125 MCG (5000 UT) TABS 5,000 IU OTC vitamin D3 daily.   hydrochlorothiazide  (HYDRODIURIL ) 25 MG tablet Take 1 tablet (25 mg total) by mouth daily.   loratadine -pseudoephedrine  (CLARITIN -D 12-HOUR) 5-120 MG tablet Take 1 tablet by mouth 2 (two) times daily.   meclizine  (ANTIVERT ) 25 MG tablet TAKE 1 TABLET BY MOUTH THREE TIMES DAILY AS NEEDED FOR DIZZINESS   Multiple Vitamins-Minerals (ZINC PO) Take by mouth. Take on tablet daily   multivitamin (ONE-A-DAY MEN'S) TABS tablet Take 1 tablet by mouth daily.   [DISCONTINUED] fluticasone  (FLONASE ) 50 MCG/ACT nasal spray 1 spray 2 times daily after neil med or AYR sinus rinses   [DISCONTINUED] hydrochlorothiazide  (HYDRODIURIL ) 12.5 MG tablet Take 1 tablet (12.5 mg total) by mouth daily.   [DISCONTINUED] cyclobenzaprine  (FLEXERIL ) 10 MG tablet TAKE 1 TABLET BY MOUTH AT BEDTIME   [DISCONTINUED] pregabalin  (LYRICA ) 25  MG capsule 2 capsules at night   No facility-administered encounter medications on file as of 09/07/2023.    Past Medical History:  Diagnosis Date   Bell's palsy    Chronic sinusitis    HTN (hypertension)    Left-sided Bell's palsy 04/12/2020   Meniere's disease (cochlear hydrops)    Seasonal allergies    Shingles    Vertigo 11/20/2020   Vitamin D  deficiency     Past Surgical History:  Procedure Laterality Date   NASAL SINUS SURGERY      Family History  Problem Relation Age of Onset   Hypertension Mother    Thyroid disease Mother    Diabetes Mother        Pre   Glaucoma Father    Cataracts Father    Hypertension Sister    Cancer Maternal Grandfather        Colon    Social History   Socioeconomic History   Marital status: Divorced    Spouse name: Not on file   Number of children: 4   Years of education: college   Highest education level: Bachelor's degree (e.g., BA, AB, BS)  Occupational History   Occupation: Optician, dispensing  Tobacco Use   Smoking status: Former    Current packs/day: 0.00    Average packs/day: 1 pack/day for 4.0 years (4.0 ttl pk-yrs)    Types: Cigarettes    Start date: 2000    Quit date: 2004    Years since quitting: 21.4   Smokeless tobacco: Never  Vaping Use   Vaping  status: Never Used  Substance and Sexual Activity   Alcohol use: Yes    Comment: Couple times a week   Drug use: No   Sexual activity: Yes    Birth control/protection: None  Other Topics Concern   Not on file  Social History Narrative   Right-handed.   Caffeine use: 1 cup per day.   Social Drivers of Corporate investment banker Strain: Low Risk  (09/07/2023)   Overall Financial Resource Strain (CARDIA)    Difficulty of Paying Living Expenses: Not very hard  Food Insecurity: No Food Insecurity (09/07/2023)   Hunger Vital Sign    Worried About Running Out of Food in the Last Year: Never true    Ran Out of Food in the Last Year: Never true  Transportation Needs: No  Transportation Needs (09/07/2023)   PRAPARE - Administrator, Civil Service (Medical): No    Lack of Transportation (Non-Medical): No  Physical Activity: Insufficiently Active (09/07/2023)   Exercise Vital Sign    Days of Exercise per Week: 3 days    Minutes of Exercise per Session: 40 min  Stress: No Stress Concern Present (09/07/2023)   [employer] of Occupational Health - Occupational Stress Questionnaire    Feeling of Stress : Only a little  Social Connections: Moderately Integrated (09/07/2023)   Social Connection and Isolation Panel [NHANES]    Frequency of Communication with Friends and Family: More than three times a week    Frequency of Social Gatherings with Friends and Family: Twice a week    Attends Religious Services: 1 to 4 times per year    Active Member of [employer] or Organizations: No    Attends Banker Meetings: Never    Marital Status: Married  Catering manager Violence: Not At Risk (09/07/2023)   Humiliation, Afraid, Rape, and Kick questionnaire    Fear of Current or Ex-Partner: No    Emotionally Abused: No    Physically Abused: No    Sexually Abused: No    ROS See HPI above    Objective  BP (!) 150/102   Pulse 100   Temp 98.2 F (36.8 C) (Oral)   Ht 6\' 3"  (1.905 m)   Wt 293 lb (132.9 kg)   SpO2 97%   BMI 36.62 kg/m   Physical Exam Vitals reviewed.  Constitutional:      General: He is not in acute distress.    Appearance: Normal appearance. He is obese. He is not ill-appearing, toxic-appearing or diaphoretic.  HENT:     Head: Normocephalic and atraumatic.  Eyes:     General:        Right eye: No discharge.        Left eye: No discharge.     Conjunctiva/sclera: Conjunctivae normal.  Cardiovascular:     Rate and Rhythm: Normal rate and regular rhythm.     Heart sounds: Normal heart sounds. No murmur heard.    No friction rub. No gallop.  Pulmonary:     Effort: Pulmonary effort is normal. No respiratory distress.     Breath  sounds: Normal breath sounds.  Musculoskeletal:        General: Normal range of motion.  Skin:    General: Skin is warm and dry.  Neurological:     General: No focal deficit present.     Mental Status: He is alert and oriented to person, place, and time. Mental status is at baseline.  Psychiatric:  Mood and Affect: Mood normal.        Behavior: Behavior normal.        Thought Content: Thought content normal.        Judgment: Judgment normal.      Assessment & Plan:  Primary hypertension Assessment & Plan: -Blood pressure is elevated. Prescribed Hydrochlorothiazide  25mg  daily in the AM. Recommend to monitor his blood pressure with an upper arm cuff twice a day. Please bring readings to his next appointment.  -Discussed about warning signs of a stroke and what to do if symptoms occurred.  -Patient was previously on HCTZ, he prefers to restart medication. Concerned that HCTZ may not be enough to control BP.  -Ordered CMP to assess kidney function and potassium level.   Orders: -     hydroCHLOROthiazide ; Take 1 tablet (25 mg total) by mouth daily.  Dispense: 90 tablet; Refill: 3 -     Comprehensive metabolic panel with GFR  Chronic sinusitis, unspecified location -     Ambulatory referral to ENT  Class 2 severe obesity due to excess calories with serious comorbidity and body mass index (BMI) of 36.0 to 36.9 in adult (HCC) -     CBC with Differential/Platelet -     Comprehensive metabolic panel with GFR -     Hemoglobin A1c -     TSH  Colon cancer screening -     Ambulatory referral to Gastroenterology  Encounter to establish care   1.Review health maintenance:  -Colonoscopy: Referral to GI.  -Covid booster: Declines  -Tdap vaccine: Unknown, recommend to obtain every 10 years or if an injury occurs  2.Placed a referral to ENT for chronic sinusitis.  3.Ordered labs (CBC, CMP, A1c, and TSH) based on BMI-obesity and HTN. Patient is not fasting and will need lipids to be  drawn at next appointment.   Return in about 2 weeks (around 09/21/2023) for follow-up.   Aishi Courts, NP

## 2023-09-22 ENCOUNTER — Ambulatory Visit: Admitting: Family Medicine

## 2023-09-29 ENCOUNTER — Encounter: Payer: Self-pay | Admitting: Family Medicine

## 2023-09-29 ENCOUNTER — Ambulatory Visit: Admitting: Family Medicine

## 2023-09-29 VITALS — BP 130/86 | HR 100 | Temp 98.2°F | Ht 75.0 in | Wt 289.0 lb

## 2023-09-29 DIAGNOSIS — D72819 Decreased white blood cell count, unspecified: Secondary | ICD-10-CM | POA: Diagnosis not present

## 2023-09-29 DIAGNOSIS — I10 Essential (primary) hypertension: Secondary | ICD-10-CM | POA: Diagnosis not present

## 2023-09-29 LAB — CBC WITH DIFFERENTIAL/PLATELET
Basophils Absolute: 0 10*3/uL (ref 0.0–0.1)
Basophils Relative: 0.4 % (ref 0.0–3.0)
Eosinophils Absolute: 0.1 10*3/uL (ref 0.0–0.7)
Eosinophils Relative: 4 % (ref 0.0–5.0)
HCT: 43.2 % (ref 39.0–52.0)
Hemoglobin: 13.8 g/dL (ref 13.0–17.0)
Lymphocytes Relative: 41.2 % (ref 12.0–46.0)
Lymphs Abs: 1.5 10*3/uL (ref 0.7–4.0)
MCHC: 32 g/dL (ref 30.0–36.0)
MCV: 82.9 fl (ref 78.0–100.0)
Monocytes Absolute: 0.3 10*3/uL (ref 0.1–1.0)
Monocytes Relative: 7.9 % (ref 3.0–12.0)
Neutro Abs: 1.7 10*3/uL (ref 1.4–7.7)
Neutrophils Relative %: 46.5 % (ref 43.0–77.0)
Platelets: 216 10*3/uL (ref 150.0–400.0)
RBC: 5.22 Mil/uL (ref 4.22–5.81)
RDW: 13.8 % (ref 11.5–15.5)
WBC: 3.6 10*3/uL — ABNORMAL LOW (ref 4.0–10.5)

## 2023-09-29 NOTE — Patient Instructions (Signed)
 It was a pleasure seeing you today!  Continue taking prescribed hydrochlorothiazide  25mg  daily. Recommend bringing blood pressure machine in to ensure accuracy or buy another machine. Monitor blood pressure daily.  Labs ordered (CBC) to evaluate white blood cell count.  Printed off referral for colonoscopy made at [facility]. Recommend calling the number on the paper to schedule.  Follow-up in 2 weeks via in person or telehealth.

## 2023-09-29 NOTE — Progress Notes (Signed)
 Established Patient Office Visit  Subjective   Patient ID: Warren Herrera, male    DOB: [birth year]  Age: 48 y.o. MRN: [id number]  Chief Complaint  Patient presents with   Medical Management of Chronic Issues    Hypertension     Presents today for a 2 week follow-up for hypertension. He was prescribed hydrochlorothiazide  25mg  on 09/07/23. He reports that he has been on the medication in the past for hypertension. Has felt better since taking medication. He reports that he has been monitoring his blood pressure at home. He has been getting increased readings around 160s/90s. He reports that he thinks he needs a new blood pressure cuff or to bring in his to ensure accuracy. Has dizziness due to his vertigo. Denies dizziness with position changes, headache, lower extremity swelling, chest pain, palpitations, and blurred vision.   Patient had a slightly low white blood cell count on lab work form 09/07/23. Interested in redrawing lab at today's visit.    Review of Systems  Eyes:  Negative for blurred vision.  Respiratory:  Negative for shortness of breath.   Cardiovascular:  Negative for chest pain, palpitations and leg swelling.  Neurological:  Positive for dizziness (from vertigo).   Objective:   BP 130/86   Pulse 100   Temp 98.2 F (36.8 C) (Oral)   Ht 6' 3 (1.905 m)   Wt 289 lb (131.1 kg)   SpO2 96%   BMI 36.12 kg/m  BP Readings from Last 3 Encounters:  09/29/23 130/86  09/07/23 (!) 150/102  12/04/21 137/82    Physical Exam Constitutional:      Appearance: Normal appearance. He is obese.  HENT:     Head: Normocephalic and atraumatic.   Eyes:     Extraocular Movements: Extraocular movements intact.     Conjunctiva/sclera: Conjunctivae normal.    Cardiovascular:     Rate and Rhythm: Normal rate.     Pulses: Normal pulses.     Heart sounds: Normal heart sounds.  Pulmonary:     Effort: Pulmonary effort is normal.     Breath sounds: Normal breath sounds.    Musculoskeletal:        General: Normal range of motion.     Cervical back: Normal range of motion.   Skin:    General: Skin is warm and dry.   Neurological:     Mental Status: He is alert and oriented to person, place, and time.   Psychiatric:        Mood and Affect: Mood normal.        Behavior: Behavior normal.        Thought Content: Thought content normal.        Judgment: Judgment normal.      No results found for any visits on 09/29/23.  Last CBC Lab Results  Component Value Date   WBC 3.7 (L) 09/07/2023   HGB 13.5 09/07/2023   HCT 41.4 09/07/2023   MCV 82.5 09/07/2023   MCH 27.4 03/12/2022   RDW 14.1 09/07/2023   PLT 221.0 09/07/2023    The 10-year ASCVD risk score (Arnett DK, et al., 2019) is: 6.8%    Assessment & Plan:   Problem List Items Addressed This Visit   None Visit Diagnoses       Essential hypertension, malignant    -  Primary     Leukopenia, unspecified type          Continue taking prescribed hydrochlorothiazide  25mg  daily. Recommend bringing blood pressure  machine in to ensure accuracy or buy another machine. Monitor blood pressure daily.  Labs ordered (CBC) to evaluate white blood cell count.  Printed off referral for colonoscopy made at  GI. Recommend calling the number on the paper to schedule.  Follow-up in 2 weeks via in person or telehealth.   Return in about 2 weeks (around 10/13/2023) for 2 week follow-up in person or telemedicine.    Lum KANDICE Quan, RN

## 2023-10-21 ENCOUNTER — Telehealth: Payer: Self-pay

## 2023-10-21 ENCOUNTER — Encounter

## 2023-10-21 NOTE — Telephone Encounter (Signed)
Multiple attempts made to reach patient- NO answer-message left for patient to call back to the office to reschedule PV appt- if patient fails to call back to the office prior to end of business day ---PV and procedure appts will be cancelled and a no show letter will be sent to the patient;

## 2023-10-21 NOTE — Telephone Encounter (Signed)
 Patient had a pre-visit scheduled on 10/21/23 at 8:00 am.  RN attempted calling three separate times with no answer. Colonoscopy for 11/16/2023 has been cancelled. RN sent letter to patient notifying of cancellation.

## 2023-10-25 ENCOUNTER — Telehealth: Admitting: Family Medicine

## 2023-11-12 ENCOUNTER — Ambulatory Visit: Admitting: Family Medicine

## 2023-11-16 ENCOUNTER — Encounter: Admitting: Gastroenterology

## 2023-11-25 ENCOUNTER — Encounter (INDEPENDENT_AMBULATORY_CARE_PROVIDER_SITE_OTHER): Payer: Self-pay | Admitting: Otolaryngology

## 2023-11-25 ENCOUNTER — Ambulatory Visit (INDEPENDENT_AMBULATORY_CARE_PROVIDER_SITE_OTHER): Admitting: Otolaryngology

## 2023-11-25 VITALS — BP 153/107 | HR 97 | Ht 75.0 in | Wt 275.0 lb

## 2023-11-25 DIAGNOSIS — J342 Deviated nasal septum: Secondary | ICD-10-CM

## 2023-11-25 DIAGNOSIS — J343 Hypertrophy of nasal turbinates: Secondary | ICD-10-CM | POA: Diagnosis not present

## 2023-11-25 DIAGNOSIS — T485X5A Adverse effect of other anti-common-cold drugs, initial encounter: Secondary | ICD-10-CM

## 2023-11-25 DIAGNOSIS — J328 Other chronic sinusitis: Secondary | ICD-10-CM | POA: Diagnosis not present

## 2023-11-25 DIAGNOSIS — J3489 Other specified disorders of nose and nasal sinuses: Secondary | ICD-10-CM

## 2023-11-25 DIAGNOSIS — J310 Chronic rhinitis: Secondary | ICD-10-CM

## 2023-11-25 DIAGNOSIS — R0981 Nasal congestion: Secondary | ICD-10-CM | POA: Diagnosis not present

## 2023-11-25 DIAGNOSIS — G510 Bell's palsy: Secondary | ICD-10-CM

## 2023-11-25 DIAGNOSIS — H8102 Meniere's disease, left ear: Secondary | ICD-10-CM

## 2023-11-25 MED ORDER — AMOXICILLIN-POT CLAVULANATE 875-125 MG PO TABS: 1.0000 | ORAL_TABLET | Freq: Two times a day (BID) | ORAL | 0 refills | Status: AC

## 2023-11-25 MED ORDER — AZELASTINE HCL 0.1 % NA SOLN: 2.0000 | Freq: Two times a day (BID) | NASAL | 12 refills | Status: AC

## 2023-11-25 MED ORDER — PREDNISONE 20 MG PO TABS: 20.0000 mg | ORAL_TABLET | Freq: Every day | ORAL | 0 refills | Status: AC

## 2023-11-25 MED ORDER — FLUTICASONE PROPIONATE 50 MCG/ACT NA SUSP: 2.0000 | Freq: Two times a day (BID) | NASAL | 6 refills | Status: AC

## 2023-11-25 NOTE — Patient Instructions (Addendum)
 I have ordered an imaging study for you to complete prior to your next visit. Please call [facility] Scheduling at [phone] to schedule your imaging if you have not received a call within 24 hours. If you are unable to complete your imaging study prior to your next scheduled visit please call our office to let us  know.   Use two sprays of flonase  in each nostril twice per day  right after, use astelin  spray two sprays each nostril twice per day  Also take a separate bottle of flonase  and mix it half and half with afrin --- then once that bottle is in half, then refill it with just flonase  and do that until you are just using flonase .  Take 20mg  prednisone  tablet once daily for 10 days; take augmentin  tablet twice daily for 10 days.   Aureliano Med OR NETI Pot Nasal Saline Rinse - use daily; stop 3 days before CT scan - start nasal saline rinses with NeilMed Bottle available over the counter    Nasal Saline Irrigation instructions: If you choose to make your own salt water solution, You will need: Salt (kosher, canning, or pickling salt) Baking soda Nasal irrigation bottle (i.e. Aureliano Med Sinus Rinse) Measuring spoon ( teaspoon) Distilled / boiled water   Mix solution Mix 1 teaspoon of salt, 1/2 teaspoon of baking soda and 1 cup of water into irrigation bottle ** May use saline packet instead of homemade recipe for this step if you prefer If medicine was prescribed to be mixed with solution, place this into bottle Examples 2 inches of 2% mupirocin ointment Budesonide solution Position your head: Lean over sink (about 45 degrees) Rotate head (about 45 degrees) so that one nostril is above the other Irrigate Insert tip of irrigation bottle into upper nostril so it forms a comfortable seal Irrigate while breathing through your mouth May remove the straw from the bottle in order to irrigate the entire solution (important if medicine was added) Exhale through nose when  finished and blow nose as necessary  Repeat on opposite side with other 1/2 of solution (120 mL) or remake solution if all 240 mL was used on first side Wash irrigation bottle regularly, replace every 3 months

## 2023-11-25 NOTE — Progress Notes (Signed)
 Dear Dr. Billy, Here is my assessment for our mutual patient, Warren Herrera. Thank you for allowing me the opportunity to care for your patient. Please do not hesitate to contact me should you have any other questions. Sincerely, Dr. Eldora Blanch  Otolaryngology Clinic Note Referring provider: Dr. Billy HPI:  Warren Herrera is a 48 y.o. male kindly referred by Dr. Billy for evaluation of chronic sinusitis  Initial visit (11/2023): He reports that he has had chronic sinonasal issues for about 15 years, and reports that he has had multiple exacerbations. His symptoms during an infection include very thick mucus and reports can't blow it out. Some discolored drainage. Reports frontal pressure and hyposmia. Also reports that he has typical AR symptoms include itchy eyes, runny nose. He reports that his symptoms often vary day to day and come on suddenly, not associated with a URI.   He was previously followed by Dr. Pamella and noted to have chronic max sinusitis and possible nasal polyp and septal deviation for which he underwent FESS and septoplasty. He reports that it did improve his symptoms. He reports that now he is doing worse over past 5 years, with aforementioned symptoms. He reports that he has not gotten abx/steroids for this recently. He reports that he is doing moderately well currently with some congestion (left worse than right). Currently using afrin nasal spray daily and claritin  D. Afrin and claritin  D help. Does report that he has more left sided issues than right.   He is not using any nasal sprays. Does not use irrigations but did use Neti Pot several years ago.   He has had allergy testing done -- been a while -- allergic to dogs, cats, trees, grass.   Of note, he was diagnosed with left Bell's Palsy in 2021, and underwent neurologic workup including MRI. He then acute vertigo with left sided tinnitus and ear pressure. He does have intermittent dizziness currently,  which can last for a few hours (temp/pressure exacerbates it). When he has exacerbations, associated symptoms include dizziness and nausea (this he attributes to his sinuses) with some left ear tinnitus (worse with worse nasal symptoms). Denies tinnitus worsening or ear fullness that worsens during it or fluctuating hearing. No otalgia, otorrhea, infections. He was referred to [facility] but did not follow through. He has tried meclizine  for this - using almost every day and this does help. His last audio was a few years ago (~2023) and he reports that it was normal.  He is on hydrochlorothiazide , but not taking it.  H&N Surgery: Nasal/Sinus Surgery (2010) Personal or FHx of bleeding dz or anesthesia difficulty: no  GLP-1: no AP/AC: no  PMHx: HTN, Bell's palsy  Independent Review of Additional Tests or Records:  Philippe Billy (09/07/2023): noted HTN on hydrochlorothiazide  and chronic sinusitis; Dx: CRS; Rx; ref to ENT Dr. Mable (05/23/2021): complex ENT h/o; acute left CN 7 weakness, neuro workup done; intermittent vertigo since with left tinnitus; mild weakness currently; Dx: Facial weakness, vertigo; Normal audiometric testing; Dx: Doubt active meniere's, f/u 6 months with audio and ref to [facility] Megan Millikan (03/26/2021): vertigo, bell's palsy. Daily dizziness, not positional; March/April 2022 had acute vertigo with tinnitus; headaches resolved.Dx: Vertigo, Bell's palsy; Rx: ref to ENT Dr. Pamella (04/30/2015): right facial pain, c/f dental infection; h/o sinus an septal surgery (2010); prior AIT; helped on abx; and steroids; Dx: Chronic sinusitis; Rx: abx/steroid, CT CBC and CMP 09/07/2023: BUN/Cr 15/1.18; WBC 3.7, Eos 100 Prior AI workup 2022: negative MRI IAC  11/04/2020 reviewed with respect to ears: no retrocochlear lesions, no mastoid or ME effusion appreciated [facility] 03/27/2020 interpreted with respect to sinuses: right ethmoid moderate and right frontal opacification, left sphenoid bubbly  secretions; unable to visualize other paranasal sinuses comprehensively PMH/Meds/All/SocHx/FamHx/ROS:   Past Medical History:  Diagnosis Date   Bell's palsy    Chronic sinusitis    HTN (hypertension)    Left-sided Bell's palsy 04/12/2020   Meniere's disease (cochlear hydrops)    Seasonal allergies    Shingles    Vertigo 11/20/2020   Vitamin D  deficiency      Past Surgical History:  Procedure Laterality Date   NASAL SINUS SURGERY      Family History  Problem Relation Age of Onset   Hypertension Mother    Thyroid disease Mother    Diabetes Mother        Pre   Glaucoma Father    Cataracts Father    Hypertension Sister    Cancer Maternal Grandfather        Colon     Social Connections: Moderately Integrated (09/07/2023)   Social Connection and Isolation Panel    Frequency of Communication with Friends and Family: More than three times a week    Frequency of Social Gatherings with Friends and Family: Twice a week    Attends Religious Services: 1 to 4 times per year    Active Member of [employer] or Organizations: No    Attends Banker Meetings: Never    Marital Status: Married      Current Outpatient Medications:    amoxicillin -clavulanate (AUGMENTIN ) 875-125 MG tablet, Take 1 tablet by mouth 2 (two) times daily., Disp: 20 tablet, Rfl: 0   azelastine  (ASTELIN ) 0.1 % nasal spray, Place 2 sprays into both nostrils 2 (two) times daily. Use in each nostril as directed, Disp: 30 mL, Rfl: 12   Cholecalciferol (VITAMIN D3) 125 MCG (5000 UT) TABS, 5,000 IU OTC vitamin D3 daily., Disp: 90 tablet, Rfl: 3   fluticasone  (FLONASE ) 50 MCG/ACT nasal spray, Place 2 sprays into both nostrils in the morning and at bedtime., Disp: 16 g, Rfl: 6   hydrochlorothiazide  (HYDRODIURIL ) 25 MG tablet, Take 1 tablet (25 mg total) by mouth daily., Disp: 90 tablet, Rfl: 3   loratadine -pseudoephedrine  (CLARITIN -D 12-HOUR) 5-120 MG tablet, Take 1 tablet by mouth 2 (two) times daily., Disp: 60  tablet, Rfl: 0   meclizine  (ANTIVERT ) 25 MG tablet, TAKE 1 TABLET BY MOUTH THREE TIMES DAILY AS NEEDED FOR DIZZINESS, Disp: 270 tablet, Rfl: 0   Multiple Vitamins-Minerals (ZINC PO), Take by mouth. Take on tablet daily, Disp: , Rfl:    multivitamin (ONE-A-DAY MEN'S) TABS tablet, Take 1 tablet by mouth daily., Disp: 90 tablet, Rfl: 0   Physical Exam:   BP (!) 153/107 (BP Location: Left Arm, Patient Position: Sitting, Cuff Size: Large) Comment: Patient states he follows up with PCP for BP control  Pulse 97   Ht 6' 3 (1.905 m)   Wt 275 lb (124.7 kg)   SpO2 94%   BMI 34.37 kg/m   Salient findings:  CN II-XII intact left left midface and lower face branches of facial nerve weak - HB 2-3/6  Bilateral EAC clear and TM intact with well pneumatized middle ear spaces Weber 512: mid Rinne 512: AC > BC b/l  DH negative, no gross nystagmus, no wide based gait Anterior rhinoscopy: Septum relatively midline; bilateral inferior turbinates without significant hypertrophy; Nasal endoscopy was indicated to better evaluate the nose and paranasal  sinuses, given the patient's history and exam findings, and is detailed below. No lesions of oral cavity/oropharynx Left mod cottle positive No obviously palpable neck masses/lymphadenopathy/thyromegaly No respiratory distress or stridor  Seprately Identifiable Procedures:  Prior to initiating any procedures, risks/benefits/alternatives were explained to the patient and verbal consent obtained. PROCEDURE: Bilateral Diagnostic Rigid Nasal Endoscopy Pre-procedure diagnosis: Concern for chronic sinusitis Post-procedure diagnosis: same Indication: See pre-procedure diagnosis and physical exam above Complications: None apparent EBL: 0 mL Anesthesia: Lidocaine  4% and topical decongestant was topically sprayed in each nasal cavity  Description of Procedure:  Patient was identified. A rigid 30 degree endoscope was utilized to evaluate the sinonasal cavities, mucosa,  sinus ostia and turbinates and septum.  Overall, signs of mucosal inflammation are modest but global mucosal edema noted.  No mucopurulence, polyps, or masses noted.   Right Middle meatus: no purulence Right SE Recess: no purulence Left MM: no purulence Left SE Recess: no purulence There is no change suggestive of ethmoidectomy or large max antrostomy on either side  CPT CODE -- 68768 - Mod 25   Impression & Plans:  Eriverto Byrnes is a 48 y.o. male with:  1. Other chronic sinusitis   2. Nasal septal deviation   3. Hypertrophy of both inferior nasal turbinates   4. Nasal obstruction   5. Nasal congestion   6. Bell's palsy   7. Meniere's disease of left ear   8. Rhinitis medicamentosa    Multiple issues today - possible some component of CRS but not medically managed maximally so will start with that. Will obtain post-treatment CT as well From facial nerve/otologic standpoint - does not appear consistent with meniere's and he's on meclizine  - will obtain audiogram. If normal, could be migraine component and can discuss vestib eval; d/w pt re: vest rehab but he declined From nasal obstruction standpoint, do wonder if static collapse due to midface weakness is contributing  - Flonase  and astelin  BID - Daily rinses - Pred and Augmentin  burst - Post-treatment sinus CT - Quit afrin - use mixing strategy - advised to keep diary of sx - Agree with hydrochlorothiazide ; low salt diet - f/u 6-8 weeks with audio  See below regarding exact medications prescribed this encounter including dosages and route: Meds ordered this encounter  Medications   fluticasone  (FLONASE ) 50 MCG/ACT nasal spray    Sig: Place 2 sprays into both nostrils in the morning and at bedtime.    Dispense:  16 g    Refill:  6   azelastine  (ASTELIN ) 0.1 % nasal spray    Sig: Place 2 sprays into both nostrils 2 (two) times daily. Use in each nostril as directed    Dispense:  30 mL    Refill:  12   predniSONE  (DELTASONE )  20 MG tablet    Sig: Take 1 tablet (20 mg total) by mouth daily with breakfast for 10 days.    Dispense:  10 tablet    Refill:  0   amoxicillin -clavulanate (AUGMENTIN ) 875-125 MG tablet    Sig: Take 1 tablet by mouth 2 (two) times daily.    Dispense:  20 tablet    Refill:  0      Thank you for allowing me the opportunity to care for your patient. Please do not hesitate to contact me should you have any other questions.  Sincerely, Eldora Blanch, MD Otolaryngologist (ENT), [facility] Phone: [phone] Fax: [phone]  12/13/2023, 11:45 AM   MDM:  Level 4 - 99204 Complexity/Problems addressed:  mod - chronic problems with exacerbation Data complexity: high - independent imaging interpretation; review of notes, labs, tests - Morbidity: mod  - Prescription Drug prescribed or managed: y

## 2023-12-24 ENCOUNTER — Ambulatory Visit (AMBULATORY_SURGERY_CENTER): Admitting: *Deleted

## 2023-12-24 VITALS — Ht 75.0 in | Wt 280.0 lb

## 2023-12-24 DIAGNOSIS — Z1211 Encounter for screening for malignant neoplasm of colon: Secondary | ICD-10-CM

## 2023-12-24 MED ORDER — NA SULFATE-K SULFATE-MG SULF 17.5-3.13-1.6 GM/177ML PO SOLN: 1.0000 | Freq: Once | ORAL | 0 refills | Status: AC

## 2023-12-24 NOTE — Progress Notes (Signed)
 Pt's name and DOB verified at the beginning of the pre-visit with 2 identifiers   Pt denies any difficulty with ambulating,sitting, laying down or rolling side to side  Pt has no issues moving head neck or swallowing  No egg or soy allergy known to patient   No issues known to pt with past sedation  No FH of Malignant Hyperthermia  Pt is not on home 02   Pt is not on blood thinners   Pt has frequent issues with constipation RN instructed pt to use Miralax per bottles instructions a week before prep days. Pt states they will  Pt is not on dialysis  Pt denise any abnormal heart rhythms   Pt denies any upcoming cardiac testing  Patient's chart reviewed by Norleen Schillings CNRA prior to pre-visit and patient appropriate for the [facility].  Pre-visit completed and red dot placed by patient's name on their procedure day (on provider's schedule).    Visit by phone  Pt states weight is 280 lb   Pt given  both [facility] main # and MD on call # prior to instructions.  Informed pt to come in at the time discussed and is shown on PV instructions.  Pt instructed to use Single[employer] or GoodRx for a price reduction on prep  Instructed pt where to find PV instructions in My Chart  Instructed pt on all aspects of written instructions including med holds clothing to wear and foods to eat and not eat as well as after procedure legal restrictions and to call MD on call if needed.. Pt states understanding. Instructed pt to review instructions again prior to procedure and call main # given if has any questions or any issues. Pt states they will.

## 2024-01-26 ENCOUNTER — Ambulatory Visit (INDEPENDENT_AMBULATORY_CARE_PROVIDER_SITE_OTHER): Admitting: Otolaryngology

## 2024-01-26 ENCOUNTER — Ambulatory Visit (INDEPENDENT_AMBULATORY_CARE_PROVIDER_SITE_OTHER): Admitting: Audiology

## 2024-01-31 ENCOUNTER — Telehealth: Payer: Self-pay | Admitting: Gastroenterology

## 2024-01-31 NOTE — Telephone Encounter (Signed)
 Good morning Dr. Legrand,   I received a call from this patient requesting to reschedule his procedure that is set for October the 31 st due to his house being sold sooner then what he expected and is having to move out this weekend. Patient has been reschedule to December the 19 th per his request. Please advise.    Thank you.

## 2024-02-02 NOTE — Telephone Encounter (Signed)
 Understood, thanks for the update.  That timing is fine since it is a routine screening exam.  H Danis

## 2024-02-04 ENCOUNTER — Encounter: Admitting: Gastroenterology

## 2024-02-23 ENCOUNTER — Ambulatory Visit (HOSPITAL_COMMUNITY)

## 2024-03-15 ENCOUNTER — Ambulatory Visit (INDEPENDENT_AMBULATORY_CARE_PROVIDER_SITE_OTHER): Admitting: Otolaryngology

## 2024-03-15 ENCOUNTER — Encounter (INDEPENDENT_AMBULATORY_CARE_PROVIDER_SITE_OTHER): Payer: Self-pay | Admitting: Otolaryngology

## 2024-03-15 ENCOUNTER — Ambulatory Visit (INDEPENDENT_AMBULATORY_CARE_PROVIDER_SITE_OTHER): Admitting: Audiology

## 2024-03-15 VITALS — BP 181/99 | HR 80 | Ht 75.0 in | Wt 285.0 lb

## 2024-03-15 DIAGNOSIS — J328 Other chronic sinusitis: Secondary | ICD-10-CM | POA: Diagnosis not present

## 2024-03-15 DIAGNOSIS — Z0110 Encounter for examination of ears and hearing without abnormal findings: Secondary | ICD-10-CM

## 2024-03-15 DIAGNOSIS — J343 Hypertrophy of nasal turbinates: Secondary | ICD-10-CM | POA: Diagnosis not present

## 2024-03-15 DIAGNOSIS — R0981 Nasal congestion: Secondary | ICD-10-CM

## 2024-03-15 DIAGNOSIS — J342 Deviated nasal septum: Secondary | ICD-10-CM

## 2024-03-15 DIAGNOSIS — G510 Bell's palsy: Secondary | ICD-10-CM

## 2024-03-15 DIAGNOSIS — J310 Chronic rhinitis: Secondary | ICD-10-CM | POA: Diagnosis not present

## 2024-03-15 DIAGNOSIS — R42 Dizziness and giddiness: Secondary | ICD-10-CM

## 2024-03-15 DIAGNOSIS — J3489 Other specified disorders of nose and nasal sinuses: Secondary | ICD-10-CM | POA: Diagnosis not present

## 2024-03-15 DIAGNOSIS — G43809 Other migraine, not intractable, without status migrainosus: Secondary | ICD-10-CM

## 2024-03-15 MED ORDER — AMOXICILLIN-POT CLAVULANATE 875-125 MG PO TABS: 1.0000 | ORAL_TABLET | Freq: Two times a day (BID) | ORAL | 0 refills | Status: AC

## 2024-03-15 MED ORDER — PREDNISONE 20 MG PO TABS: 20.0000 mg | ORAL_TABLET | Freq: Every day | ORAL | 0 refills | Status: AC

## 2024-03-15 NOTE — Progress Notes (Signed)
 Dear Dr. Billy, Here is my assessment for our mutual patient, Warren Herrera. Thank you for allowing me the opportunity to care for your patient. Please do not hesitate to contact me should you have any other questions. Sincerely, Dr. Eldora Blanch  Otolaryngology Clinic Note Referring provider: Dr. Billy HPI:  Warren Herrera is a 48 y.o. male kindly referred by Dr. Billy for evaluation of chronic sinusitis  Initial visit (11/2023): He reports that he has had chronic sinonasal issues for about 15 years, and reports that he has had multiple exacerbations. His symptoms during an infection include very thick mucus and reports can't blow it out. Some discolored drainage. Reports frontal pressure and hyposmia. Also reports that he has typical AR symptoms include itchy eyes, runny nose. He reports that his symptoms often vary day to day and come on suddenly, not associated with a URI.   He was previously followed by Warren Herrera and noted to have chronic max sinusitis and possible nasal polyp and septal deviation for which he underwent FESS and septoplasty. He reports that it did improve his symptoms. He reports that now he is doing worse over past 5 years, with aforementioned symptoms. He reports that he has not gotten abx/steroids for this recently. He reports that he is doing moderately well currently with some congestion (left worse than right). Currently using afrin nasal spray daily and claritin  D. Afrin and claritin  D help. Does report that he has more left sided issues than right.   He is not using any nasal sprays. Does not use irrigations but did use Neti Pot several years ago.   He has had allergy testing done -- been a while -- allergic to dogs, cats, trees, grass.   Of note, he was diagnosed with left Bell's Palsy in 2021, and underwent neurologic workup including MRI. He then acute vertigo with left sided tinnitus and ear pressure. He does have intermittent dizziness currently,  which can last for a few hours (temp/pressure exacerbates it). When he has exacerbations, associated symptoms include dizziness and nausea (this he attributes to his sinuses) with some left ear tinnitus (worse with worse nasal symptoms). Denies tinnitus worsening or ear fullness that worsens during it or fluctuating hearing. No otalgia, otorrhea, infections. He was referred to [facility] but did not follow through. He has tried meclizine  for this - using almost every day and this does help. His last audio was a few years ago (~2023) and he reports that it was normal.  He is on hydrochlorothiazide , but not taking it. --------------------------------------------------------- 03/15/2024 Returns for follow up. Insurance denied CT. He continues to have left sided tinnitus, and continues to have vertigo --- several since last visit. He reports, though, the room does not spin --- feels like I'm spinning. Does have some nausea with it. No fluctuating tinnitus. Still using meclizine  -- not using it as often. He continues to have sinus pressure which is chronic. Abx and steroids did help significantly last helped. He is using flonase  and astelin  (uses daily) and rinses (intermittently). He is off the afrin. Temp/pressure does bring about sinus symptoms.   H&N Surgery: Nasal/Sinus Surgery (2010) Personal or FHx of bleeding dz or anesthesia difficulty: no  GLP-1: no AP/AC: no  PMHx: HTN, Bell's palsy  Independent Review of Additional Tests or Records:  Warren Herrera (09/07/2023): noted HTN on hydrochlorothiazide  and chronic sinusitis; Dx: CRS; Rx; ref to ENT Dr. Mable (05/23/2021): complex ENT h/o; acute left CN 7 weakness, neuro workup done; intermittent vertigo since with  left tinnitus; mild weakness currently; Dx: Facial weakness, vertigo; Normal audiometric testing; Dx: Doubt active meniere's, f/u 6 months with audio and ref to [facility] Warren Herrera (03/26/2021): vertigo, bell's palsy. Daily  dizziness, not positional; March/April 2022 had acute vertigo with tinnitus; headaches resolved.Dx: Vertigo, Bell's palsy; Rx: ref to ENT Warren Herrera (04/30/2015): right facial pain, c/f dental infection; h/o sinus an septal surgery (2010); prior AIT; helped on abx; and steroids; Dx: Chronic sinusitis; Rx: abx/steroid, CT CBC and CMP 09/07/2023: BUN/Cr 15/1.18; WBC 3.7, Eos 100 Prior AI workup 2022: negative MRI IAC 11/04/2020 reviewed with respect to ears: no retrocochlear lesions, no mastoid or ME effusion appreciated [facility] 03/27/2020 interpreted with respect to sinuses: right ethmoid moderate and right frontal opacification, left sphenoid bubbly secretions; unable to visualize other paranasal sinuses comprehensively  03/2024 Audiogram was independently reviewed and interpreted by me and it reveals - normal hearing thresholds AU; A/A tymps; WRT 100% AU at 50dB SNHL= Sensorineural hearing loss  PMH/Meds/All/SocHx/FamHx/ROS:   Past Medical History:  Diagnosis Date   Anxiety    Bell's palsy    Chronic sinusitis    HTN (hypertension)    Left-sided Bell's palsy 04/12/2020   Meniere's disease (cochlear hydrops)    Seasonal allergies    Shingles    Vertigo 11/20/2020   Vitamin D  deficiency      Past Surgical History:  Procedure Laterality Date   NASAL SINUS SURGERY      Family History  Problem Relation Age of Onset   Hypertension Mother    Thyroid disease Mother    Diabetes Mother        Pre   Glaucoma Father    Cataracts Father    Hypertension Sister    Colon cancer Maternal Grandfather    Cancer Maternal Grandfather        Colon   Colon polyps Neg Hx    Esophageal cancer Neg Hx    Rectal cancer Neg Hx    Stomach cancer Neg Hx      Social Connections: Moderately Integrated (09/07/2023)   Social Connection and Isolation Panel    Frequency of Communication with Friends and Family: More than three times a week    Frequency of Social Gatherings with Friends and Family: Twice a week     Attends Religious Services: 1 to 4 times per year    Active Member of [employer] or Organizations: No    Attends Banker Meetings: Never    Marital Status: Married      Current Outpatient Medications:    amoxicillin -clavulanate (AUGMENTIN ) 875-125 MG tablet, Take 1 tablet by mouth 2 (two) times daily., Disp: 20 tablet, Rfl: 0   azelastine  (ASTELIN ) 0.1 % nasal spray, Place 2 sprays into both nostrils 2 (two) times daily. Use in each nostril as directed, Disp: 30 mL, Rfl: 12   Cholecalciferol (VITAMIN D3) 125 MCG (5000 UT) TABS, 5,000 IU OTC vitamin D3 daily., Disp: 90 tablet, Rfl: 3   fluticasone  (FLONASE ) 50 MCG/ACT nasal spray, Place 2 sprays into both nostrils in the morning and at bedtime., Disp: 16 g, Rfl: 6   hydrochlorothiazide  (HYDRODIURIL ) 25 MG tablet, Take 1 tablet (25 mg total) by mouth daily., Disp: 90 tablet, Rfl: 3   meclizine  (ANTIVERT ) 25 MG tablet, TAKE 1 TABLET BY MOUTH THREE TIMES DAILY AS NEEDED FOR DIZZINESS, Disp: 270 tablet, Rfl: 0   Multiple Vitamins-Minerals (ZINC PO), Take by mouth. Take on tablet daily, Disp: , Rfl:    multivitamin (ONE-A-DAY MEN'S) TABS  tablet, Take 1 tablet by mouth daily., Disp: 90 tablet, Rfl: 0   OVER THE COUNTER MEDICATION, daily at 12 noon. Sea moss, Disp: , Rfl:    OVER THE COUNTER MEDICATION, daily at 12 noon. [place], Disp: , Rfl:    Zinc 50 MG TABS, Take by mouth., Disp: , Rfl:    loratadine -pseudoephedrine  (CLARITIN -D 12-HOUR) 5-120 MG tablet, Take 1 tablet by mouth 2 (two) times daily. (Patient not taking: Reported on 03/15/2024), Disp: 60 tablet, Rfl: 0   Physical Exam:   BP (!) 181/99 (BP Location: Left Wrist, Patient Position: Sitting, Cuff Size: Normal)   Pulse 80   Ht 6' 3 (1.905 m)   Wt 285 lb (129.3 kg)   SpO2 95%   BMI 35.62 kg/m   Salient findings:  CN II-XII intact left left midface and lower face branches of facial nerve weak - HB 2-3/6  Bilateral EAC clear and TM intact with well pneumatized middle ear  spaces Weber 512: mid Rinne 512: AC > BC b/l  DH negative, no gross nystagmus, no wide based gait Anterior rhinoscopy: Septum modest deviation; bilateral inferior turbinates without significant hypertrophy; Nasal endoscopy was indicated to better evaluate the nose and paranasal sinuses, given the patient's history and exam findings, and is detailed below. No lesions of oral cavity/oropharynx Left mod cottle positive No obviously palpable neck masses/lymphadenopathy/thyromegaly No respiratory distress or stridor  Seprately Identifiable Procedures:  Prior to initiating any procedures, risks/benefits/alternatives were explained to the patient and verbal consent obtained. PROCEDURE: Bilateral Diagnostic Rigid Nasal Endoscopy Pre-procedure diagnosis: Concern for chronic sinusitis Post-procedure diagnosis: same Indication: See pre-procedure diagnosis and physical exam above Complications: None apparent EBL: 0 mL Anesthesia: Lidocaine  4% and topical decongestant was topically sprayed in each nasal cavity  Description of Procedure:  Patient was identified. A rigid 30 degree endoscope was utilized to evaluate the sinonasal cavities, mucosa, sinus ostia and turbinates and septum.  Overall, signs of mucosal inflammation are modest but global mucosal edema noted but it has improved.  No mucopurulence, polyps, or masses noted.   Right Middle meatus: no purulence Right SE Recess: no purulence Left MM: no purulence Left SE Recess: no purulence There is no change suggestive of ethmoidectomy or large max antrostomy on either side --- retained uncinate at the very least on left  CPT CODE -- 68768 - Mod 25   Impression & Plans:  Ryoma Nofziger is a 48 y.o. male with:  1. Normal hearing test of both ears   2. Other chronic sinusitis   3. Nasal septal deviation   4. Hypertrophy of both inferior nasal turbinates   5. Nasal obstruction   6. Nasal congestion   7. Bell's palsy   8. Rhinitis medicamentosa     Multiple issues today - possible some component of CRS but not has improved medically but still with some mucosal edema; From nasal obstruction standpoint, do wonder if static collapse due to midface weakness is contributing. CT denied, will re-order and investigate. He wishes to figure out insurance issues with [employer] premiums first.  From facial nerve/otologic standpoint - does not appear consistent with meniere's and he's on meclizine  PRN; audiogram normal. As such, query if there is vestibular migraine component -- he agrees for neuro eval; d/w pt re: vest eval but due to insurance issues, he declined currently  - Continue Flonase  and astelin  BID - Daily rinses - Pred and Augmentin  burst -- after discussion again.  - Post-treatment sinus CT - Has discontinued afrin - advised to  keep diary of sx - Can d/c HCTZ - f/u 3 months, will refer to neuro  See below regarding exact medications prescribed this encounter including dosages and route: No orders of the defined types were placed in this encounter.     Thank you for allowing me the opportunity to care for your patient. Please do not hesitate to contact me should you have any other questions.  Sincerely, Eldora Blanch, MD Otolaryngologist (ENT), [facility] Phone: [phone] Fax: [phone]  03/15/2024, 9:47 AM   MDM:  [phone] Complexity/Problems addressed: mod - chronic problems, multiple Data complexity: low - Morbidity: mod  - Prescription Drug prescribed or managed: y

## 2024-03-15 NOTE — Progress Notes (Signed)
°  [address], [address] [place], [place] [address] [phone]  Audiological Evaluation    Name: Warren Herrera     DOB:   [birth year]      MRN:   [id number]                                                                                     Service Date: 03/15/2024     Accompanied by: self self   Patient comes today after Dr. Tobie, ENT sent a referral for a hearing evaluation due to concerns with dizziness.   Symptoms Yes Details  Hearing loss  [x]  Left muffled hearing  Tinnitus  [x]  Left ear  Ear pain/ infections/pressure  []    Balance problems  [x]  Vertigo - reports he spins ( not the room) and may last hours. Says that allergies /sinus issues or stress may be possible triggers. Also reports that he may need to take mor ethan one meclizine  or lay down to reduce symptoms.  Noise exposure history  []    Previous ear surgeries  []    Family history of hearing loss  []    Amplification  []    Other  []      Otoscopy: Right ear: Clear external ear canal and notable landmarks visualized on the tympanic membrane. Left ear:  Clear external ear canal and notable landmarks visualized on the tympanic membrane.  Tympanometry: Right ear: Type A - Normal external ear canal volume with normal middle ear pressure and normal tympanic membrane compliance. Findings are consistent with normal middle ear function. Left ear: Type A - Normal external ear canal volume with normal middle ear pressure and normal tympanic membrane compliance. Findings are consistent with normal middle ear function.  Hearing Evaluation The hearing test results were completed under headphones and re-checked with inserts and results are deemed to be of good reliability. Test technique:  conventional    Pure tone Audiometry: Right ear- Hearing within the normal range from 125 Hz - 8000 Hz Left ear-  Hearing within the normal range from 125 Hz - 8000 Hz  Speech Audiometry: Right ear- Speech Reception Threshold (SRT)  was obtained at 0 dBHL. Left ear-Speech Reception Threshold (SRT) was obtained at 0 dBHL.   Word Recognition Score Tested using NU-6 (recorded) Right ear: 100 % was obtained at a presentation level of 50 dBHL with contralateral masking which is deemed as  excellent. Left ear: 100% was obtained at a presentation level of 50 dBHL with contralateral masking which is deemed as  excellent.   Impression: There is not a significant difference in pure-tone thresholds between ears. There is not a significant difference in the word recognition score in between ears.    Recommendations: Follow up with ENT as scheduled. Return for a hearing evaluation if concerns with hearing and tinnitus changes arise or per MD recommendation.   Halia Franey MARIE LEROUX-MARTINEZ, AUD

## 2024-03-15 NOTE — Patient Instructions (Signed)
 Vestibular Migraines Take Augmentin  875 mg by mouth (PO) twice daily for 10 days; take with food, take probiotic or yogurt with it Take 20mg  prednisone  once daily for 7 days

## 2024-03-17 ENCOUNTER — Encounter: Payer: Self-pay | Admitting: Gastroenterology

## 2024-03-23 ENCOUNTER — Telehealth: Payer: Self-pay | Admitting: Gastroenterology

## 2024-03-23 NOTE — Telephone Encounter (Signed)
 Patient had questions regarding his prep because he had to reschedule and needed additional info. We went over instructions and times and he did not have any further questions.

## 2024-03-23 NOTE — Telephone Encounter (Signed)
 Patient is requesting a call to discuss prep instructions for tomorrow procedure 12/19 at 7:30 am. States he has a few questions. Please advise, thank you

## 2024-03-24 ENCOUNTER — Encounter: Payer: Self-pay | Admitting: Gastroenterology

## 2024-03-24 ENCOUNTER — Ambulatory Visit (AMBULATORY_SURGERY_CENTER): Admitting: Gastroenterology

## 2024-03-24 VITALS — BP 135/96 | HR 70 | Temp 97.9°F | Resp 13 | Ht 75.0 in | Wt 280.0 lb

## 2024-03-24 DIAGNOSIS — K5730 Diverticulosis of large intestine without perforation or abscess without bleeding: Secondary | ICD-10-CM

## 2024-03-24 DIAGNOSIS — K648 Other hemorrhoids: Secondary | ICD-10-CM

## 2024-03-24 DIAGNOSIS — D124 Benign neoplasm of descending colon: Secondary | ICD-10-CM

## 2024-03-24 DIAGNOSIS — K635 Polyp of colon: Secondary | ICD-10-CM | POA: Diagnosis not present

## 2024-03-24 DIAGNOSIS — Z1211 Encounter for screening for malignant neoplasm of colon: Secondary | ICD-10-CM | POA: Diagnosis present

## 2024-03-24 MED ORDER — SODIUM CHLORIDE 0.9 % IV SOLN: 500.0000 mL | Freq: Once | INTRAVENOUS | Status: DC

## 2024-03-24 NOTE — Progress Notes (Signed)
 Called to room to assist during endoscopic procedure.  Patient ID and intended procedure confirmed with present staff. Received instructions for my participation in the procedure from the performing physician.

## 2024-03-24 NOTE — Progress Notes (Signed)
 History and Physical:  This patient presents for endoscopic testing for: Encounter Diagnosis  Name Primary?   Special screening for malignant neoplasms, colon Yes    Average risk for colorectal cancer.  1st screening exam.  Patient denies chronic abdominal pain, rectal bleeding, constipation or diarrhea.   Patient is otherwise without complaints or active issues today.   Past Medical History: Past Medical History:  Diagnosis Date   Anxiety    Bell's palsy    Chronic sinusitis    HTN (hypertension)    Left-sided Bell's palsy 04/12/2020   Meniere's disease (cochlear hydrops)    Seasonal allergies    Shingles    Vertigo 11/20/2020   Vitamin D  deficiency      Past Surgical History: Past Surgical History:  Procedure Laterality Date   NASAL SINUS SURGERY      Allergies: Allergies[1]  Outpatient Meds: Current Outpatient Medications  Medication Sig Dispense Refill   amoxicillin -clavulanate (AUGMENTIN ) 875-125 MG tablet Take 1 tablet by mouth 2 (two) times daily. 20 tablet 0   amoxicillin -clavulanate (AUGMENTIN ) 875-125 MG tablet Take 1 tablet by mouth 2 (two) times daily. 20 tablet 0   azelastine  (ASTELIN ) 0.1 % nasal spray Place 2 sprays into both nostrils 2 (two) times daily. Use in each nostril as directed 30 mL 12   Cholecalciferol (VITAMIN D3) 125 MCG (5000 UT) TABS 5,000 IU OTC vitamin D3 daily. 90 tablet 3   fluticasone  (FLONASE ) 50 MCG/ACT nasal spray Place 2 sprays into both nostrils in the morning and at bedtime. 16 g 6   hydrochlorothiazide  (HYDRODIURIL ) 25 MG tablet Take 1 tablet (25 mg total) by mouth daily. 90 tablet 3   loratadine -pseudoephedrine  (CLARITIN -D 12-HOUR) 5-120 MG tablet Take 1 tablet by mouth 2 (two) times daily. (Patient not taking: Reported on 12/24/2023) 60 tablet 0   meclizine  (ANTIVERT ) 25 MG tablet TAKE 1 TABLET BY MOUTH THREE TIMES DAILY AS NEEDED FOR DIZZINESS 270 tablet 0   Multiple Vitamins-Minerals (ZINC PO) Take by mouth. Take on tablet  daily     multivitamin (ONE-A-DAY MEN'S) TABS tablet Take 1 tablet by mouth daily. 90 tablet 0   OVER THE COUNTER MEDICATION daily at 12 noon. Sea moss     OVER THE COUNTER MEDICATION daily at 12 noon. Gensing     Zinc 50 MG TABS Take by mouth.     Current Facility-Administered Medications  Medication Dose Route Frequency Provider Last Rate Last Admin   0.9 %  sodium chloride infusion  500 mL Intravenous Once Danis, Makyra Corprew L III, MD          ___________________________________________________________________ Objective   Exam:  BP 129/84   Pulse 84   Temp 97.9 F (36.6 C)   Ht 6' 3 (1.905 m)   Wt 280 lb (127 kg)   SpO2 98%   BMI 35.00 kg/m   CV: regular , S1/S2 Resp: clear to auscultation bilaterally, normal RR and effort noted GI: soft, no tenderness, with active bowel sounds.   Assessment: Encounter Diagnosis  Name Primary?   Special screening for malignant neoplasms, colon Yes     Plan: Colonoscopy   The benefits and risks of the planned procedure(s) were described in detail with the patient or (when appropriate) their health care proxy.  Risks were outlined as including, but not limited to, bleeding, infection, perforation, adverse medication reaction leading to cardiac or pulmonary decompensation, pancreatitis (if ERCP).  The limitation of incomplete mucosal visualization was also discussed.  No guarantees or warranties were given.  The patient was provided an opportunity to ask questions and all were answered. The patient agreed with the plan.   The patient is appropriate for an endoscopic procedure in the ambulatory setting.   - Victory Brand, MD        [1] No Known Allergies

## 2024-03-24 NOTE — Op Note (Signed)
 [facility] Patient Name: Warren Herrera Procedure Date: 03/24/2024 8:38 AM MRN: [id number] Endoscopist: Victory L. Legrand , MD, [id number] Age: 48 Referring MD:  Date of Birth: 1975/11/26 Gender: Male Account #: [id number] Procedure:                Colonoscopy Indications:              Screening for colorectal malignant neoplasm, This                            is the patient's first colonoscopy Medicines:                Monitored Anesthesia Care Procedure:                Pre-Anesthesia Assessment:                           - Prior to the procedure, a History and Physical                            was performed, and patient medications and                            allergies were reviewed. The patient's tolerance of                            previous anesthesia was also reviewed. The risks                            and benefits of the procedure and the sedation                            options and risks were discussed with the patient.                            All questions were answered, and informed consent                            was obtained. Prior Anticoagulants: The patient has                            taken no anticoagulant or antiplatelet agents. ASA                            Grade Assessment: II - A patient with mild systemic                            disease. After reviewing the risks and benefits,                            the patient was deemed in satisfactory condition to                            undergo the procedure.  After obtaining informed consent, the colonoscope                            was passed under direct vision. Throughout the                            procedure, the patient's blood pressure, pulse, and                            oxygen saturations were monitored continuously. The                            Olympus Scope SN: [id number] was introduced through                            the anus and advanced to  the the terminal ileum,                            with identification of the appendiceal orifice and                            IC valve. The colonoscopy was performed without                            difficulty. The patient tolerated the procedure                            well. The quality of the bowel preparation was                            good. The terminal ileum, ileocecal valve,                            appendiceal orifice, and rectum were photographed. Scope In: 8:52:18 AM Scope Out: 9:04:48 AM Scope Withdrawal Time: 0 hours 8 minutes 49 seconds  Total Procedure Duration: 0 hours 12 minutes 30 seconds  Findings:                 The perianal and digital rectal examinations were                            normal.                           Repeat examination of right colon under NBI                            performed.                           Diverticula were found in the left colon.                           A diminutive polyp was found in the proximal  descending colon. The polyp was sessile. The polyp                            was removed with a cold snare. Resection and                            retrieval were complete.                           Internal hemorrhoids were found. The hemorrhoids                            were small.                           The exam was otherwise without abnormality on                            direct and retroflexion views. Complications:            No immediate complications. Estimated Blood Loss:     Estimated blood loss was minimal. Impression:               - Diverticulosis in the left colon.                           - One diminutive polyp in the proximal descending                            colon, removed with a cold snare. Resected and                            retrieved.                           - Internal hemorrhoids.                           - The examination was otherwise normal on  direct                            and retroflexion views. Recommendation:           - Patient has a contact number available for                            emergencies. The signs and symptoms of potential                            delayed complications were discussed with the                            patient. Return to normal activities tomorrow.                            Written discharge instructions were provided to the  patient.                           - Resume previous diet.                           - Continue present medications.                           - Await pathology results.                           - Repeat colonoscopy is recommended for                            surveillance. The colonoscopy date will be                            determined after pathology results from today's                            exam become available for review. Jasmen Emrich L. Legrand, MD 03/24/2024 9:08:35 AM This report has been signed electronically.

## 2024-03-24 NOTE — Progress Notes (Signed)
 Pt's states no medical or surgical changes since previsit or office visit.

## 2024-03-24 NOTE — Patient Instructions (Addendum)
 Continue present medications. Await pathology results. Repeat colonoscopy is recommended for surveillance. The colonoscopy date will be determined after pathology results from today's exam become available for review.                                                                              Please read over handouts provided  YOU HAD AN ENDOSCOPIC PROCEDURE TODAY AT THE [facility]:   Refer to the procedure report that was given to you for any specific questions about what was found during the examination.  If the procedure report does not answer your questions, please call your gastroenterologist to clarify.  If you requested that your care partner not be given the details of your procedure findings, then the procedure report has been included in a sealed envelope for you to review at your convenience later.  YOU SHOULD EXPECT: Some feelings of bloating in the abdomen. Passage of more gas than usual.  Walking can help get rid of the air that was put into your GI tract during the procedure and reduce the bloating. If you had a lower endoscopy (such as a colonoscopy or flexible sigmoidoscopy) you may notice spotting of blood in your stool or on the toilet paper. If you underwent a bowel prep for your procedure, you may not have a normal bowel movement for a few days.  Please Note:  You might notice some irritation and congestion in your nose or some drainage.  This is from the oxygen used during your procedure.  There is no need for concern and it should clear up in a day or so.  SYMPTOMS TO REPORT IMMEDIATELY:  Following lower endoscopy (colonoscopy or flexible sigmoidoscopy):  Excessive amounts of blood in the stool  Significant tenderness or worsening of abdominal pains  Swelling of the abdomen that is new, acute  Fever of 100F or higher  For urgent or emergent issues, a gastroenterologist can be reached at any hour by calling [phone]. Do not use MyChart messaging for  urgent concerns.    DIET:  We do recommend a small meal at first, but then you may proceed to your regular diet.  Drink plenty of fluids but you should avoid alcoholic beverages for 24 hours.  ACTIVITY:  You should plan to take it easy for the rest of today and you should NOT DRIVE or use heavy machinery until tomorrow (because of the sedation medicines used during the test).    FOLLOW UP: Our staff will call the number listed on your records the next business day following your procedure.  We will call around 7:15- 8:00 am to check on you and address any questions or concerns that you may have regarding the information given to you following your procedure. If we do not reach you, we will leave a message.     If any biopsies were taken you will be contacted by phone or by letter within the next 1-3 weeks.  Please call us at [phone] if you have not heard about the biopsies in 3 weeks.    SIGNATURES/CONFIDENTIALITY: You and/or your care partner have signed paperwork which will be entered into your electronic medical record.  These signatures  attest to the fact that that the information above on your After Visit Summary has been reviewed and is understood.  Full responsibility of the confidentiality of this discharge information lies with you and/or your care-partner.

## 2024-03-24 NOTE — Progress Notes (Signed)
 Sedate, gd SR, tolerated procedure well, VSS, report to RN

## 2024-03-27 ENCOUNTER — Telehealth: Payer: Self-pay

## 2024-03-27 NOTE — Telephone Encounter (Signed)
" °  Follow up Call-     03/24/2024    7:44 AM  Call back number  Post procedure Call Back phone  # [phone]  Permission to leave phone message Yes     Patient questions:  Do you have a fever, pain , or abdominal swelling? No. Pain Score  0 *  Have you tolerated food without any problems? Yes.    Have you been able to return to your normal activities? Yes.    Do you have any questions about your discharge instructions: Diet   No. Medications  No. Follow up visit  No.  Do you have questions or concerns about your Care? No.  Actions: * If pain score is 4 or above: No action needed, pain <4.   "

## 2024-03-28 ENCOUNTER — Ambulatory Visit: Payer: Self-pay | Admitting: Gastroenterology

## 2024-03-28 LAB — SURGICAL PATHOLOGY

## 2024-07-19 ENCOUNTER — Ambulatory Visit (INDEPENDENT_AMBULATORY_CARE_PROVIDER_SITE_OTHER): Admitting: Otolaryngology
